# Patient Record
Sex: Female | Born: 1995 | Race: Black or African American | Hispanic: No | Marital: Single | Smoking: Never smoker
Health system: Southern US, Community
[De-identification: ages and names within clinical notes are randomized; demographics above are authoritative.]

## PROBLEM LIST (undated history)

## (undated) ENCOUNTER — Inpatient Hospital Stay (HOSPITAL_COMMUNITY): Payer: Self-pay

## (undated) DIAGNOSIS — R21 Rash and other nonspecific skin eruption: Secondary | ICD-10-CM

## (undated) DIAGNOSIS — F419 Anxiety disorder, unspecified: Secondary | ICD-10-CM

## (undated) DIAGNOSIS — T7840XA Allergy, unspecified, initial encounter: Secondary | ICD-10-CM

## (undated) DIAGNOSIS — F32A Depression, unspecified: Secondary | ICD-10-CM

## (undated) DIAGNOSIS — A6000 Herpesviral infection of urogenital system, unspecified: Secondary | ICD-10-CM

## (undated) HISTORY — PX: NO PAST SURGERIES: SHX2092

---
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
## 2013-03-19 ENCOUNTER — Inpatient Hospital Stay (HOSPITAL_COMMUNITY)
Admission: AD | Admit: 2013-03-19 | Discharge: 2013-03-19 | Disposition: A | Discharge status: Home / Self Care | Payer: PRIVATE HEALTH INSURANCE | Source: Ambulatory Visit | Attending: Obstetrics and Gynecology | Admitting: Obstetrics and Gynecology

## 2013-03-19 ENCOUNTER — Encounter (HOSPITAL_COMMUNITY): Payer: Self-pay | Admitting: *Deleted

## 2013-03-19 DIAGNOSIS — B9689 Other specified bacterial agents as the cause of diseases classified elsewhere: Secondary | ICD-10-CM | POA: Insufficient documentation

## 2013-03-19 DIAGNOSIS — N760 Acute vaginitis: Secondary | ICD-10-CM | POA: Insufficient documentation

## 2013-03-19 DIAGNOSIS — N898 Other specified noninflammatory disorders of vagina: Secondary | ICD-10-CM | POA: Insufficient documentation

## 2013-03-19 DIAGNOSIS — R300 Dysuria: Secondary | ICD-10-CM | POA: Insufficient documentation

## 2013-03-19 DIAGNOSIS — R21 Rash and other nonspecific skin eruption: Secondary | ICD-10-CM | POA: Insufficient documentation

## 2013-03-19 DIAGNOSIS — N949 Unspecified condition associated with female genital organs and menstrual cycle: Secondary | ICD-10-CM | POA: Insufficient documentation

## 2013-03-19 DIAGNOSIS — A499 Bacterial infection, unspecified: Secondary | ICD-10-CM | POA: Insufficient documentation

## 2013-03-19 HISTORY — DX: Allergy, unspecified, initial encounter: T78.40XA

## 2013-03-19 HISTORY — DX: Rash and other nonspecific skin eruption: R21

## 2013-03-19 LAB — URINE MICROSCOPIC-ADD ON

## 2013-03-19 LAB — URINALYSIS, ROUTINE W REFLEX MICROSCOPIC
Bilirubin Urine: NEGATIVE
Glucose, UA: NEGATIVE mg/dL
KETONES UR: NEGATIVE mg/dL
LEUKOCYTES UA: NEGATIVE
Nitrite: NEGATIVE
PROTEIN: NEGATIVE mg/dL
Specific Gravity, Urine: >1.03 — ABNORMAL HIGH (ref 1.005–1.030)
Urobilinogen, UA: 0.2 mg/dL (ref 0.0–1.0)
pH: 6 (ref 5.0–8.0)

## 2013-03-19 LAB — WET PREP, GENITAL
Trich, Wet Prep: NONE SEEN
YEAST WET PREP: NONE SEEN

## 2013-03-19 LAB — POCT PREGNANCY, URINE: Preg Test, Ur: NEGATIVE

## 2013-03-19 LAB — RPR: RPR Ser Ql: NONREACTIVE

## 2013-03-19 MED ORDER — ACYCLOVIR 400 MG PO TABS: ORAL_TABLET | ORAL | Status: DC

## 2013-03-19 MED ORDER — METRONIDAZOLE 500 MG PO TABS: 500.0000 mg | ORAL_TABLET | Freq: Two times a day (BID) | ORAL | Status: DC

## 2013-03-19 NOTE — MAU Note (Signed)
C/o vaginal rash that is very painful; c/o vaginal burning and itching also; pain with urination; states that she has not ever had a pelvic exam; sexually active and has implanon in place;

## 2013-03-19 NOTE — MAU Note (Signed)
Vaginal pain for last 3 days, also has noticed small red bumps on genitalia.  Having period.

## 2013-03-19 NOTE — MAU Provider Note (Signed)
History     CSN: [id number]  Arrival date and time: 03/19/13 1042   First Provider Initiated Contact with Patient 03/19/13 1216      Chief Complaint  Patient presents with  . Vaginal Pain   HPI  Pt is a 18 yo G0P0 here with report of vaginal rash that is very painful that first occurred in October 2014 and saw a provider, however a diagnosis was not made.  Rash was on entire body.  Currently, have small red bumps on vagina noticed approximately three days ago.  Rash itches and burns.  Reports dysuria.  Sexually active and with partner x 1.5 yrs.  Implanon for family planning.     Past Medical History  Diagnosis Date  . Rash due to allergy     Past Surgical History  Procedure Laterality Date  . No past surgeries      Family History  Problem Relation Age of Onset  . Alcohol abuse Neg Hx   . Arthritis Neg Hx   . Asthma Neg Hx   . Birth defects Neg Hx   . Cancer Neg Hx   . COPD Neg Hx   . Depression Neg Hx   . Diabetes Neg Hx   . Drug abuse Neg Hx   . Early death Neg Hx   . Hearing loss Neg Hx   . Heart disease Neg Hx   . Hyperlipidemia Neg Hx   . Hypertension Neg Hx   . Kidney disease Neg Hx   . Learning disabilities Neg Hx   . Mental illness Neg Hx   . Miscarriages / Stillbirths Neg Hx   . Mental retardation Neg Hx   . Vision loss Neg Hx   . Varicose Veins Neg Hx   . Stroke Neg Hx     History  Substance Use Topics  . Smoking status: Never Smoker   . Smokeless tobacco: Not on file  . Alcohol Use: No    Allergies: No Known Allergies  No prescriptions prior to admission    Review of Systems  Constitutional: Negative for fever.  Genitourinary: Positive for dysuria.       Vaginal lesions  All other systems reviewed and are negative.   Physical Exam   Blood pressure 72/42, pulse 89, temperature 98.8 F (37.1 C), temperature source Oral, resp. rate 18, height 5\' 8"  (1.727 m), weight 76.749 kg (169 lb 3.2 oz), last menstrual period  03/11/2013.  Physical Exam  Constitutional: She is oriented to person, place, and time. She appears well-developed and well-nourished. No distress.  HENT:  Head: Normocephalic.  Eyes: Pupils are equal, round, and reactive to light.  Neck: Normal range of motion. Neck supple.  Cardiovascular: Normal rate, regular rhythm and normal heart sounds.   Respiratory: Effort normal and breath sounds normal.  GI: Soft. There is no tenderness.  Genitourinary:    Cervix exhibits no motion tenderness. Right adnexum displays no mass and no tenderness. Left adnexum displays no mass and no tenderness. Vaginal discharge (creamy, yellowish discharge) found.  Negative cervical motion tenderness  Neurological: She is alert and oriented to person, place, and time. She has normal reflexes.  Skin: Skin is warm and dry.    MAU Course  Procedures  Results for orders placed during the hospital encounter of 03/19/13 (from the past 24 hour(s))  URINALYSIS, ROUTINE W REFLEX MICROSCOPIC     Status: Abnormal   Collection Time    03/19/13 11:00 AM      Result Value Ref  Range   Color, Urine YELLOW  YELLOW   APPearance CLEAR  CLEAR   Specific Gravity, Urine >1.030 (*) 1.005 - 1.030   pH 6.0  5.0 - 8.0   Glucose, UA NEGATIVE  NEGATIVE mg/dL   Hgb urine dipstick TRACE (*) NEGATIVE   Bilirubin Urine NEGATIVE  NEGATIVE   Ketones, ur NEGATIVE  NEGATIVE mg/dL   Protein, ur NEGATIVE  NEGATIVE mg/dL   Urobilinogen, UA 0.2  0.0 - 1.0 mg/dL   Nitrite NEGATIVE  NEGATIVE   Leukocytes, UA NEGATIVE  NEGATIVE  URINE MICROSCOPIC-ADD ON     Status: Abnormal   Collection Time    03/19/13 11:00 AM      Result Value Ref Range   Squamous Epithelial / LPF FEW (*) RARE   WBC, UA 0-2  <3 WBC/hpf   RBC / HPF 0-2  <3 RBC/hpf   Urine-Other MUCOUS PRESENT    POCT PREGNANCY, URINE     Status: None   Collection Time    03/19/13 11:14 AM      Result Value Ref Range   Preg Test, Ur NEGATIVE  NEGATIVE  WET PREP, GENITAL      Status: Abnormal   Collection Time    03/19/13 12:49 PM      Result Value Ref Range   Yeast Wet Prep HPF POC NONE SEEN  NONE SEEN   Trich, Wet Prep NONE SEEN  NONE SEEN   Clue Cells Wet Prep HPF POC FEW (*) NONE SEEN   WBC, Wet Prep HPF POC MANY (*) NONE SEEN    Assessment and Plan  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

## 2013-03-20 LAB — HSV(HERPES SIMPLEX VRS) I + II AB-IGM: HERPES SIMPLEX VRS I-IGM AB (EIA): 0.24 {index}

## 2013-03-20 LAB — GC/CHLAMYDIA PROBE AMP
CT Probe RNA: NEGATIVE
GC Probe RNA: NEGATIVE

## 2013-03-20 NOTE — MAU Provider Note (Signed)
Attestation of Attending Supervision of Advanced Practitioner (CNM/NP): Evaluation and management procedures were performed by the Advanced Practitioner under my supervision and collaboration.  I have reviewed the Advanced Practitioner's note and chart, and I agree with the management and plan.  [name] 03/20/2013 7:58 AM

## 2013-03-21 LAB — HERPES SIMPLEX VIRUS CULTURE: Culture: DETECTED

## 2013-03-24 ENCOUNTER — Telehealth: Payer: Self-pay | Admitting: Advanced Practice Midwife

## 2013-03-24 NOTE — Telephone Encounter (Signed)
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 VM left instructing her call MAU. Results not left on VM.

## 2013-07-07 ENCOUNTER — Inpatient Hospital Stay (HOSPITAL_COMMUNITY)
Admission: AD | Admit: 2013-07-07 | Discharge: 2013-07-08 | Disposition: A | Discharge status: Home / Self Care | Payer: PRIVATE HEALTH INSURANCE | Source: Ambulatory Visit | Attending: Obstetrics & Gynecology | Admitting: Obstetrics & Gynecology

## 2013-07-07 DIAGNOSIS — O210 Mild hyperemesis gravidarum: Secondary | ICD-10-CM | POA: Insufficient documentation

## 2013-07-07 DIAGNOSIS — B3731 Acute candidiasis of vulva and vagina: Secondary | ICD-10-CM | POA: Insufficient documentation

## 2013-07-07 DIAGNOSIS — B379 Candidiasis, unspecified: Secondary | ICD-10-CM

## 2013-07-07 DIAGNOSIS — O219 Vomiting of pregnancy, unspecified: Secondary | ICD-10-CM

## 2013-07-07 DIAGNOSIS — R109 Unspecified abdominal pain: Secondary | ICD-10-CM | POA: Insufficient documentation

## 2013-07-07 DIAGNOSIS — O2390 Unspecified genitourinary tract infection in pregnancy, unspecified trimester: Secondary | ICD-10-CM | POA: Insufficient documentation

## 2013-07-07 DIAGNOSIS — B373 Candidiasis of vulva and vagina: Secondary | ICD-10-CM | POA: Insufficient documentation

## 2013-07-07 NOTE — MAU Note (Signed)
Pt states she had +HPT. Today has had a lot of cramping and nausea/vomiting. Vaginal bleeding started today. Pt thinks LMP was the first week of may but is unsure.

## 2013-07-08 ENCOUNTER — Inpatient Hospital Stay (HOSPITAL_COMMUNITY): Payer: PRIVATE HEALTH INSURANCE

## 2013-07-08 ENCOUNTER — Encounter (HOSPITAL_COMMUNITY): Payer: Self-pay

## 2013-07-08 DIAGNOSIS — O219 Vomiting of pregnancy, unspecified: Secondary | ICD-10-CM

## 2013-07-08 LAB — CBC
HEMATOCRIT: 36.7 % (ref 36.0–46.0)
Hemoglobin: 12.5 g/dL (ref 12.0–15.0)
MCH: 29.8 pg (ref 26.0–34.0)
MCHC: 34.1 g/dL (ref 30.0–36.0)
MCV: 87.6 fL (ref 78.0–100.0)
Platelets: 240 10*3/uL (ref 150–400)
RBC: 4.19 MIL/uL (ref 3.87–5.11)
RDW: 13.2 % (ref 11.5–15.5)
WBC: 7.2 10*3/uL (ref 4.0–10.5)

## 2013-07-08 LAB — WET PREP, GENITAL
Clue Cells Wet Prep HPF POC: NONE SEEN
Trich, Wet Prep: NONE SEEN

## 2013-07-08 LAB — URINALYSIS, ROUTINE W REFLEX MICROSCOPIC
Bilirubin Urine: NEGATIVE
Glucose, UA: NEGATIVE mg/dL
Ketones, ur: >80 mg/dL — AB
LEUKOCYTES UA: NEGATIVE
Nitrite: NEGATIVE
PROTEIN: 30 mg/dL — AB
Specific Gravity, Urine: >1.03 — ABNORMAL HIGH (ref 1.005–1.030)
UROBILINOGEN UA: 1 mg/dL (ref 0.0–1.0)
pH: 6 (ref 5.0–8.0)

## 2013-07-08 LAB — URINE MICROSCOPIC-ADD ON

## 2013-07-08 LAB — GC/CHLAMYDIA PROBE AMP
CT Probe RNA: NEGATIVE
GC Probe RNA: NEGATIVE

## 2013-07-08 LAB — POCT PREGNANCY, URINE: PREG TEST UR: POSITIVE — AB

## 2013-07-08 LAB — HCG, QUANTITATIVE, PREGNANCY: hCG, Beta Chain, Quant, S: 65731 m[IU]/mL — ABNORMAL HIGH (ref <5)

## 2013-07-08 LAB — ABO/RH: ABO/RH(D): A POS

## 2013-07-08 MED ORDER — METOCLOPRAMIDE HCL 10 MG PO TABS: 10.0000 mg | ORAL_TABLET | Freq: Three times a day (TID) | ORAL | Status: DC

## 2013-07-08 MED ORDER — FLUCONAZOLE 150 MG PO TABS
150.0000 mg | ORAL_TABLET | Freq: Once | ORAL | Status: AC
  Administered 2013-07-08: 150 mg via ORAL
  Filled 2013-07-08: qty 1

## 2013-07-08 MED ORDER — ONDANSETRON 8 MG PO TBDP
8.0000 mg | ORAL_TABLET | Freq: Once | ORAL | Status: AC
  Administered 2013-07-08: 8 mg via ORAL
  Filled 2013-07-08: qty 1

## 2013-07-08 NOTE — MAU Provider Note (Signed)
History     CSN: [id number]  Arrival date and time: 07/07/13 2331   First Provider Initiated Contact with Patient 07/08/13 0050      Chief Complaint  Patient presents with  . Abdominal Cramping  . Nausea  . Emesis   Abdominal Cramping Associated symptoms include nausea and vomiting. Pertinent negatives include no constipation, diarrhea, dysuria, fever or frequency.  Emesis  Associated symptoms include abdominal pain (cramping). Pertinent negatives include no chills, diarrhea or fever.    Pt is a 18 yo here with report of vomiting x 2 days.  Vomited 6-7x.  Denies fever, body aches, or chills.  +pelvic cramping that started day.  Light bleeding noted today.  Uncertain regarding LMP.    Past Medical History  Diagnosis Date  . Rash due to allergy     Past Surgical History  Procedure Laterality Date  . No past surgeries      Family History  Problem Relation Age of Onset  . Alcohol abuse Neg Hx   . Arthritis Neg Hx   . Asthma Neg Hx   . Birth defects Neg Hx   . Cancer Neg Hx   . COPD Neg Hx   . Depression Neg Hx   . Diabetes Neg Hx   . Drug abuse Neg Hx   . Early death Neg Hx   . Hearing loss Neg Hx   . Heart disease Neg Hx   . Hyperlipidemia Neg Hx   . Hypertension Neg Hx   . Kidney disease Neg Hx   . Learning disabilities Neg Hx   . Mental illness Neg Hx   . Miscarriages / Stillbirths Neg Hx   . Mental retardation Neg Hx   . Vision loss Neg Hx   . Varicose Veins Neg Hx   . Stroke Neg Hx     History  Substance Use Topics  . Smoking status: Never Smoker   . Smokeless tobacco: Not on file  . Alcohol Use: No    Allergies: No Known Allergies  Prescriptions prior to admission  Medication Sig Dispense Refill  . acyclovir (ZOVIRAX) 400 MG tablet Take one tablet three times a day x 7 days.  Repeat if lesions reappear.  90 tablet  1  . metroNIDAZOLE (FLAGYL) 500 MG tablet Take 1 tablet (500 mg total) by mouth 2 (two) times daily.  14 tablet  0    Review of  Systems  Constitutional: Negative for fever and chills.  Gastrointestinal: Positive for nausea, vomiting and abdominal pain (cramping). Negative for diarrhea and constipation.  Genitourinary: Negative for dysuria, urgency and frequency.  All other systems reviewed and are negative.  Physical Exam   Blood pressure 115/70, pulse 86, temperature 98.8 F (37.1 C), temperature source Oral, resp. rate 18, height 5\' 7"  (1.702 m), weight 77.656 kg (171 lb 3.2 oz), SpO2 98.00%.  Physical Exam  Constitutional: She is oriented to person, place, and time. She appears well-developed and well-nourished. No distress.  HENT:  Head: Normocephalic.  Neck: Normal range of motion. Neck supple.  Cardiovascular: Normal rate and regular rhythm.   Respiratory: Effort normal and breath sounds normal.  GI: Soft. There is no tenderness.  Genitourinary: Uterus is enlarged. Cervix exhibits no motion tenderness. No bleeding around the vagina. Vaginal discharge (thick white, cottage-cheese type discharge) found.  Neurological: She is alert and oriented to person, place, and time.  Skin: Skin is warm and dry.    MAU Course  Procedures Results for orders placed during the hospital  encounter of 07/07/13 (from the past 24 hour(s))  URINALYSIS, ROUTINE W REFLEX MICROSCOPIC     Status: Abnormal   Collection Time    07/07/13 11:38 PM      Result Value Ref Range   Color, Urine YELLOW  YELLOW   APPearance CLEAR  CLEAR   Specific Gravity, Urine >1.030 (*) 1.005 - 1.030   pH 6.0  5.0 - 8.0   Glucose, UA NEGATIVE  NEGATIVE mg/dL   Hgb urine dipstick TRACE (*) NEGATIVE   Bilirubin Urine NEGATIVE  NEGATIVE   Ketones, ur >80 (*) NEGATIVE mg/dL   Protein, ur 30 (*) NEGATIVE mg/dL   Urobilinogen, UA 1.0  0.0 - 1.0 mg/dL   Nitrite NEGATIVE  NEGATIVE   Leukocytes, UA NEGATIVE  NEGATIVE  URINE MICROSCOPIC-ADD ON     Status: None   Collection Time    07/07/13 11:38 PM      Result Value Ref Range   Squamous Epithelial /  LPF RARE  RARE   WBC, UA 0-2  <3 WBC/hpf   RBC / HPF 0-2  <3 RBC/hpf   Bacteria, UA RARE  RARE   Urine-Other MUCOUS PRESENT    POCT PREGNANCY, URINE     Status: Abnormal   Collection Time    07/08/13 12:51 AM      Result Value Ref Range   Preg Test, Ur POSITIVE (*) NEGATIVE  WET PREP, GENITAL     Status: Abnormal   Collection Time    07/08/13  1:00 AM      Result Value Ref Range   Yeast Wet Prep HPF POC FEW (*) NONE SEEN   Trich, Wet Prep NONE SEEN  NONE SEEN   Clue Cells Wet Prep HPF POC NONE SEEN  NONE SEEN   WBC, Wet Prep HPF POC FEW (*) NONE SEEN  CBC     Status: None   Collection Time    07/08/13  1:10 AM      Result Value Ref Range   WBC 7.2  4.0 - 10.5 K/uL   RBC 4.19  3.87 - 5.11 MIL/uL   Hemoglobin 12.5  12.0 - 15.0 g/dL   HCT 16.136.7  09.636.0 - 04.546.0 %   MCV 87.6  78.0 - 100.0 fL   MCH 29.8  26.0 - 34.0 pg   MCHC 34.1  30.0 - 36.0 g/dL   RDW 40.913.2  81.111.5 - 91.415.5 %   Platelets 240  150 - 400 K/uL  HCG, QUANTITATIVE, PREGNANCY     Status: Abnormal   Collection Time    07/08/13  1:10 AM      Result Value Ref Range   hCG, Beta Chain, Quant, [place] [id number] (*) <5 mIU/mL  ABO/RH     Status: None   Collection Time    07/08/13  1:10 AM      Result Value Ref Range   ABO/RH(D) A POS     Ultrasound: FINDINGS:  Intrauterine gestational sac: A single intrauterine pregnancy is  identified.  Yolk sac: Yolk sac is present.  Embryo: Fetal pole is present.  Cardiac Activity: Fetal cardiac activity is identified.  Heart Rate: 111 bpm  CRL: 3.9 mm 6 w 1 d US EDC: 03/02/2014  Maternal uterus/adnexae: No subchorionic hemorrhage. Retroverted  uterus. No myometrial mass lesions identified. Small amount of free  pelvic fluid. Both ovaries are identified. A simple appearing cyst  is demonstrated on the right ovary, likely functional. No left  adnexal mass lesions.  IMPRESSION:  Single intrauterine  pregnancy. Estimated gestational age by  crown-rump length is 6 weeks 1 day. No acute  complication is  identified.   Assessment and Plan  Nausea and Vomiting in Pregnancy Abdominal Pain in Pregnancy Yeast Infection  Plan: Discharge to home Diflucan 150 mg PO in MAU Begin Prenatal Care  [facility],Tiondra Fang 07/08/2013, 12:51 AM

## 2013-11-18 ENCOUNTER — Encounter (HOSPITAL_COMMUNITY): Payer: Self-pay

## 2014-05-13 ENCOUNTER — Encounter (HOSPITAL_COMMUNITY): Payer: Self-pay | Admitting: *Deleted

## 2015-09-04 IMAGING — US US OB COMP LESS 14 WK
1 series · 13 of 28 positions shown · non-contrast
Comparison: None.

CLINICAL DATA: Pelvic pain and bleeding. Estimated gestational age
by LMP is 7 weeks 1 day. Quantitative beta HCG is [DATE].

EXAM:
OBSTETRIC <14 WK US AND TRANSVAGINAL OB US
TECHNIQUE: Both transabdominal and transvaginal ultrasound examinations were
performed for complete evaluation of the gestation as well as the
maternal uterus, adnexal regions, and pelvic cul-de-sac.
Transvaginal technique was performed to assess early pregnancy.

[Series 1: us ob comp less 14 wks · 42 acquisitions, 13 frames shown]
[im 2/42]
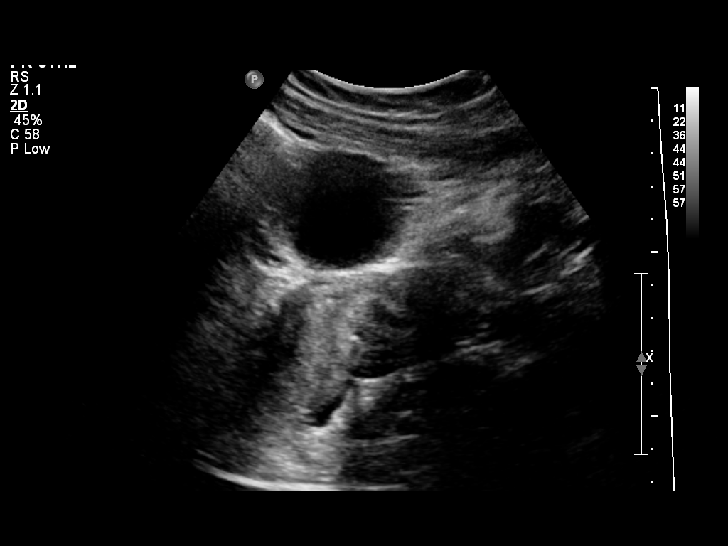
[im 5/42]
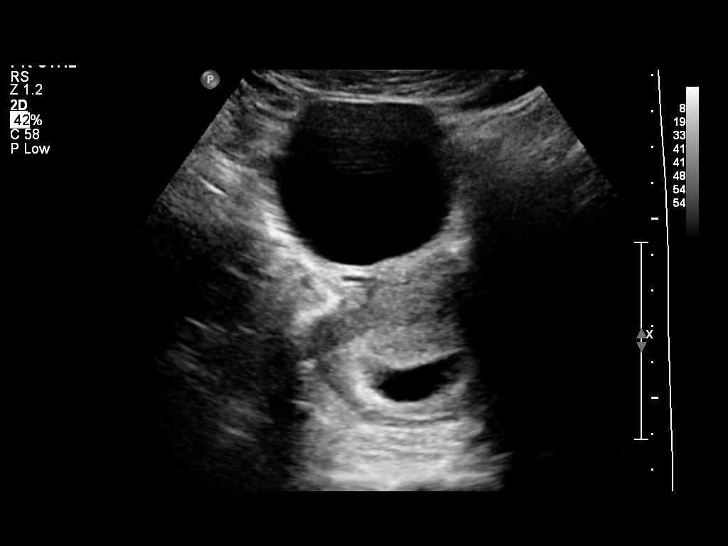
[im 8/42]
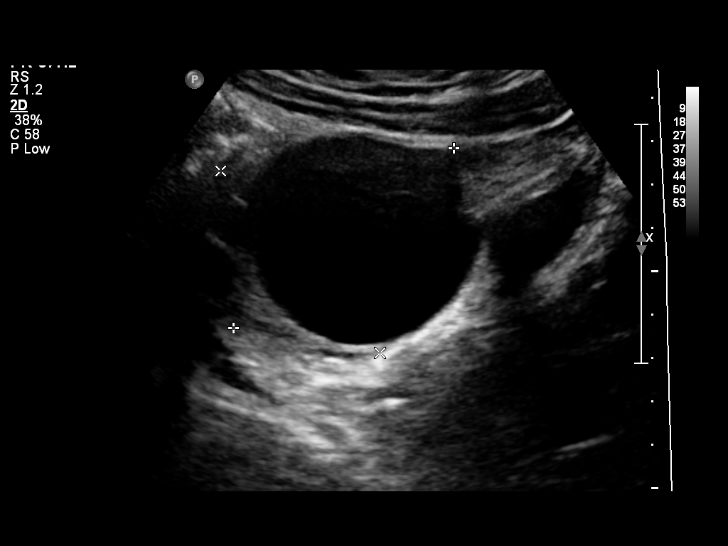
[im 11/42]
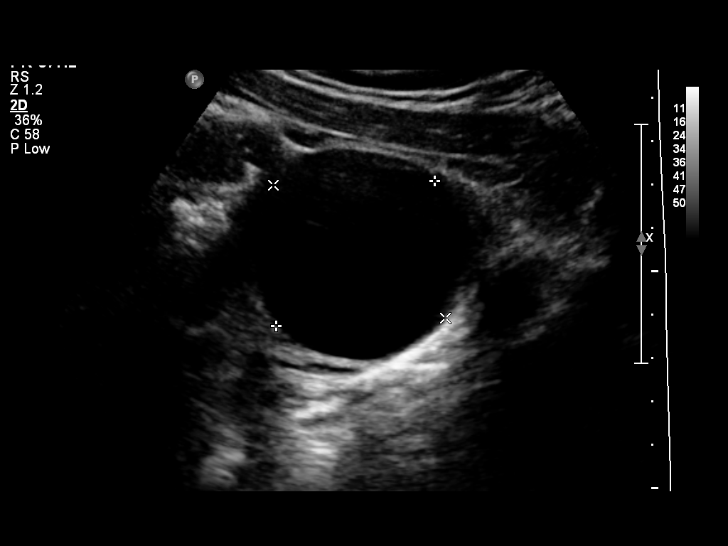
[im 14/42]
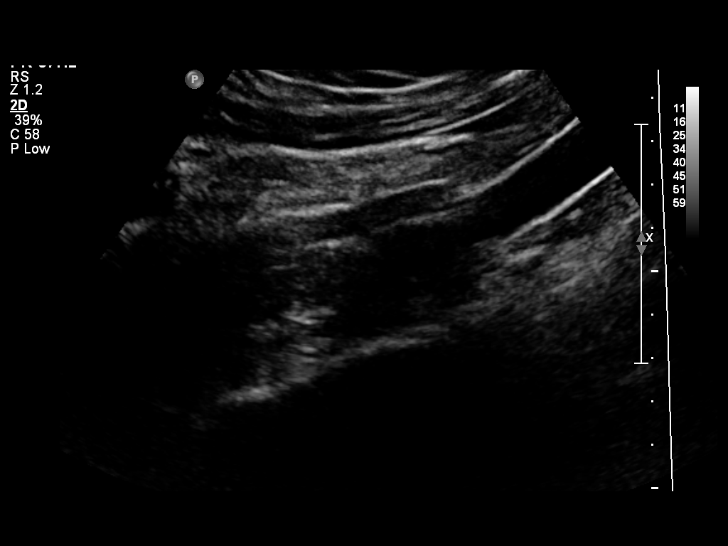
[im 17/42]
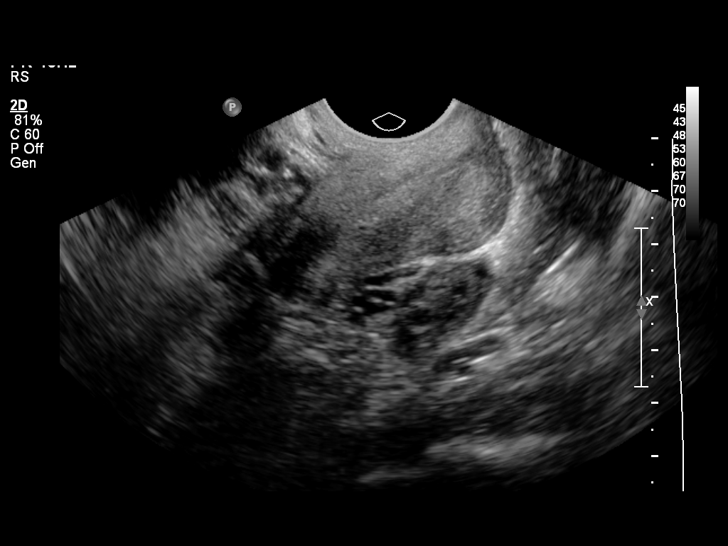
[im 22/42]
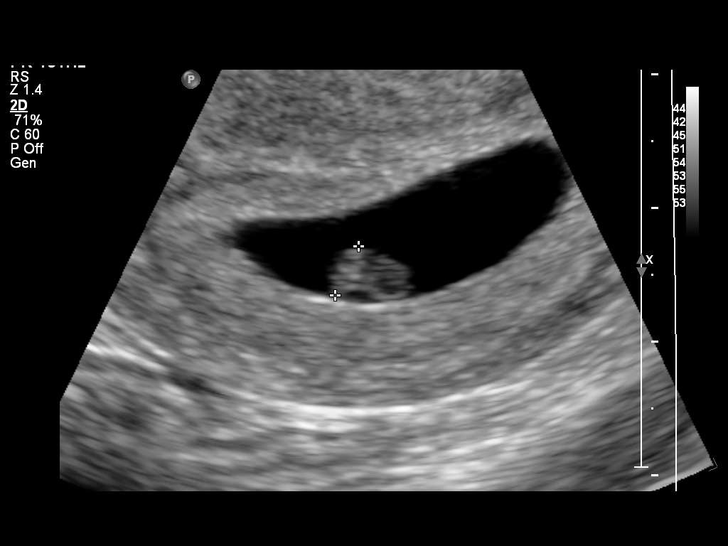
[im 25/42]
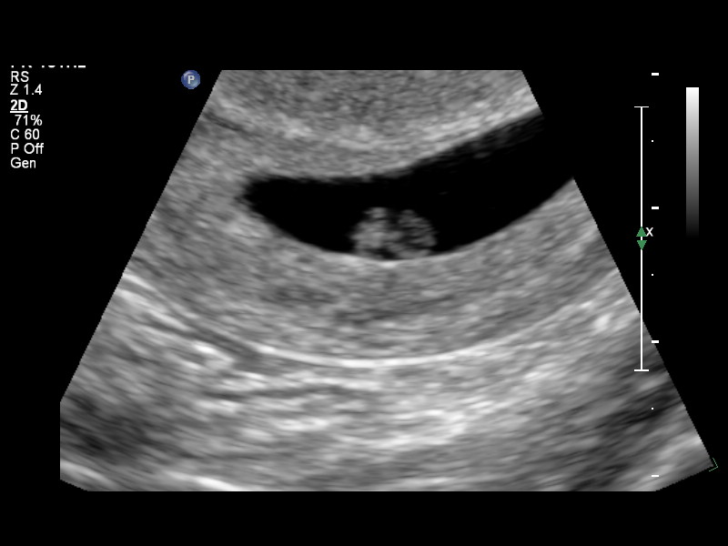
[im 28/42]
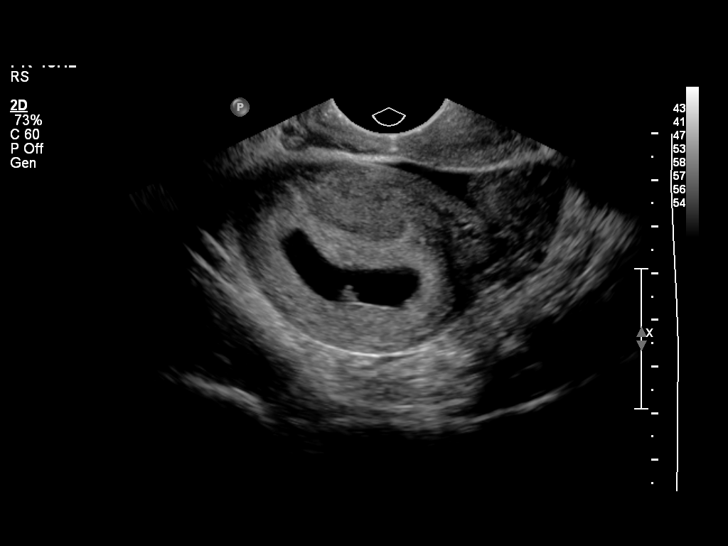
[im 31/42]
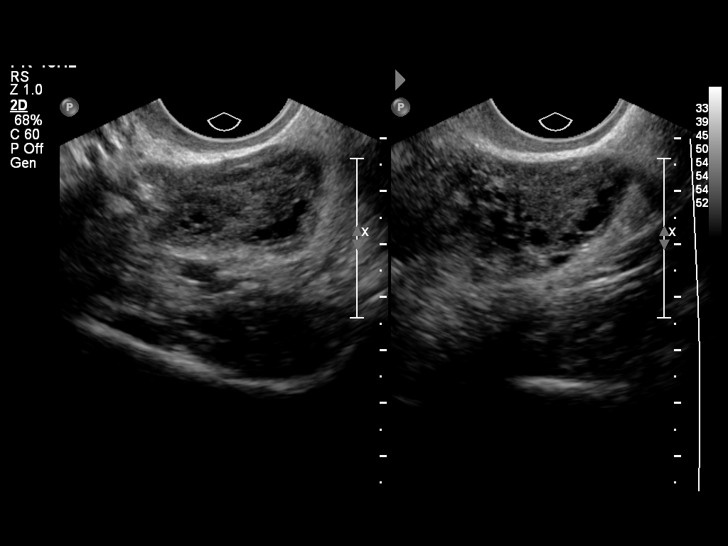
[im 34/42]
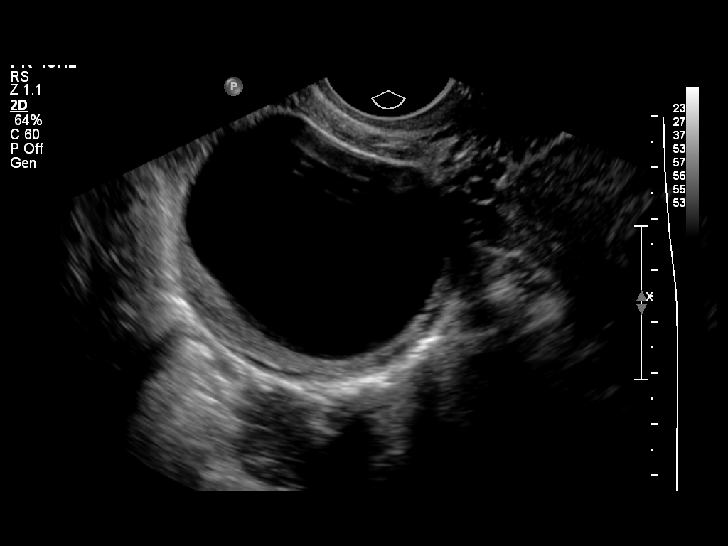
[im 37/42]
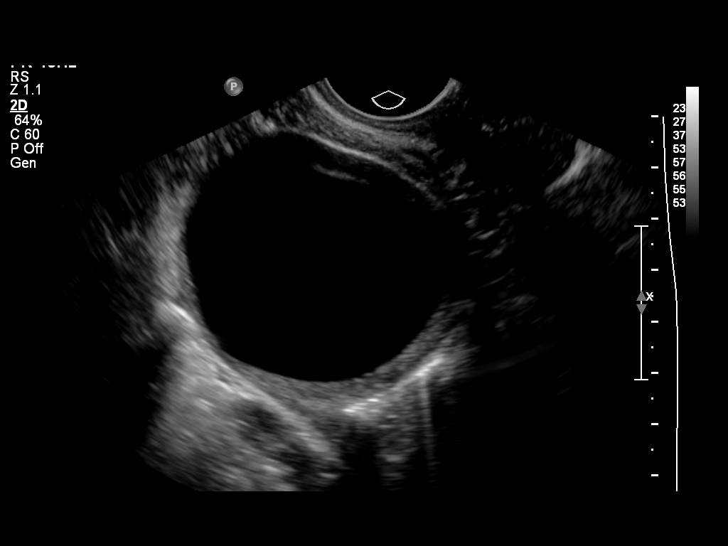
[im 40/42]
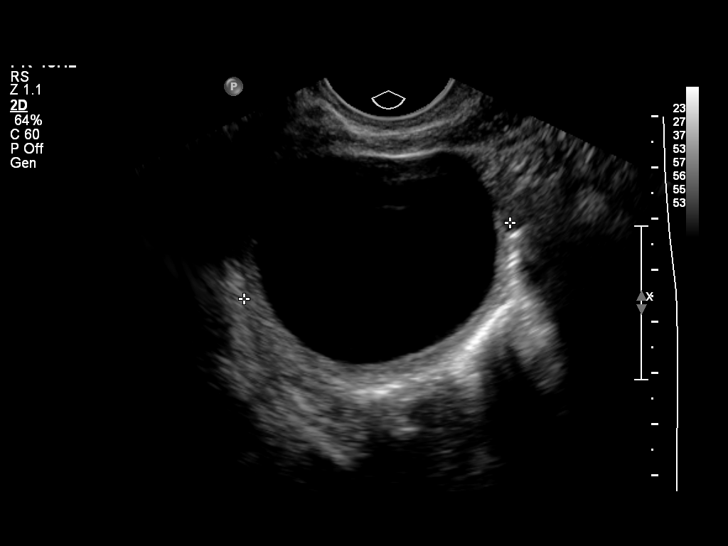

[13 of 28 positions shown; findings below may reference images not displayed]

FINDINGS: Intrauterine gestational sac: A single intrauterine pregnancy is
identified.

Yolk sac:  Yolk sac is present.

Embryo:  Fetal pole is present.

Cardiac Activity: Fetal cardiac activity is identified.

Heart Rate:  111 bpm

CRL:   3.9  mm   6 w 1 d                  US EDC: 03/02/2014

Maternal uterus/adnexae: No subchorionic hemorrhage. Retroverted
uterus. No myometrial mass lesions identified. Small amount of free
pelvic fluid. Both ovaries are identified. A simple appearing cyst
is demonstrated on the right ovary, likely functional. No left
adnexal mass lesions.
IMPRESSION: Single intrauterine pregnancy. Estimated gestational age by
crown-rump length is 6 weeks 1 day. No acute complication is
identified.

## 2020-10-29 ENCOUNTER — Emergency Department (HOSPITAL_BASED_OUTPATIENT_CLINIC_OR_DEPARTMENT_OTHER): Payer: Self-pay

## 2020-10-29 ENCOUNTER — Other Ambulatory Visit: Payer: Self-pay

## 2020-10-29 ENCOUNTER — Encounter (HOSPITAL_BASED_OUTPATIENT_CLINIC_OR_DEPARTMENT_OTHER): Payer: Self-pay | Admitting: *Deleted

## 2020-10-29 ENCOUNTER — Inpatient Hospital Stay (HOSPITAL_BASED_OUTPATIENT_CLINIC_OR_DEPARTMENT_OTHER)
Admission: AD | Admit: 2020-10-29 | Discharge: 2020-10-30 | Disposition: A | Discharge status: Home / Self Care | Payer: Medicaid Other | Attending: Obstetrics and Gynecology | Admitting: Obstetrics and Gynecology

## 2020-10-29 DIAGNOSIS — R102 Pelvic and perineal pain: Secondary | ICD-10-CM | POA: Diagnosis not present

## 2020-10-29 DIAGNOSIS — O26891 Other specified pregnancy related conditions, first trimester: Secondary | ICD-10-CM | POA: Diagnosis not present

## 2020-10-29 DIAGNOSIS — R1011 Right upper quadrant pain: Secondary | ICD-10-CM | POA: Insufficient documentation

## 2020-10-29 DIAGNOSIS — O009 Unspecified ectopic pregnancy without intrauterine pregnancy: Secondary | ICD-10-CM

## 2020-10-29 DIAGNOSIS — O2631 Retained intrauterine contraceptive device in pregnancy, first trimester: Secondary | ICD-10-CM | POA: Diagnosis not present

## 2020-10-29 DIAGNOSIS — O209 Hemorrhage in early pregnancy, unspecified: Secondary | ICD-10-CM | POA: Insufficient documentation

## 2020-10-29 DIAGNOSIS — Z79899 Other long term (current) drug therapy: Secondary | ICD-10-CM | POA: Insufficient documentation

## 2020-10-29 DIAGNOSIS — O3680X0 Pregnancy with inconclusive fetal viability, not applicable or unspecified: Secondary | ICD-10-CM | POA: Diagnosis not present

## 2020-10-29 DIAGNOSIS — N9489 Other specified conditions associated with female genital organs and menstrual cycle: Secondary | ICD-10-CM | POA: Diagnosis not present

## 2020-10-29 DIAGNOSIS — Z3490 Encounter for supervision of normal pregnancy, unspecified, unspecified trimester: Secondary | ICD-10-CM

## 2020-10-29 DIAGNOSIS — Z3A00 Weeks of gestation of pregnancy not specified: Secondary | ICD-10-CM | POA: Insufficient documentation

## 2020-10-29 DIAGNOSIS — Z640 Problems related to unwanted pregnancy: Secondary | ICD-10-CM | POA: Diagnosis not present

## 2020-10-29 HISTORY — DX: Herpesviral infection of urogenital system, unspecified: A60.00

## 2020-10-29 HISTORY — DX: Depression, unspecified: F32.A

## 2020-10-29 HISTORY — DX: Anxiety disorder, unspecified: F41.9

## 2020-10-29 LAB — URINALYSIS, ROUTINE W REFLEX MICROSCOPIC
Bilirubin Urine: NEGATIVE
Glucose, UA: NEGATIVE mg/dL
Hgb urine dipstick: NEGATIVE
Ketones, ur: NEGATIVE mg/dL
Leukocytes,Ua: NEGATIVE
Nitrite: NEGATIVE
Protein, ur: NEGATIVE mg/dL
Specific Gravity, Urine: 1.005 (ref 1.005–1.030)
pH: 6 (ref 5.0–8.0)

## 2020-10-29 LAB — CBC WITH DIFFERENTIAL/PLATELET
Abs Immature Granulocytes: 0.01 10*3/uL (ref 0.00–0.07)
Basophils Absolute: 0.1 10*3/uL (ref 0.0–0.1)
Basophils Relative: 1 %
Eosinophils Absolute: 0.1 10*3/uL (ref 0.0–0.5)
Eosinophils Relative: 2 %
HCT: 38.6 % (ref 36.0–46.0)
Hemoglobin: 12.7 g/dL (ref 12.0–15.0)
Immature Granulocytes: 0 %
Lymphocytes Relative: 36 %
Lymphs Abs: 2.3 10*3/uL (ref 0.7–4.0)
MCH: 31 pg (ref 26.0–34.0)
MCHC: 32.9 g/dL (ref 30.0–36.0)
MCV: 94.1 fL (ref 80.0–100.0)
Monocytes Absolute: 0.7 10*3/uL (ref 0.1–1.0)
Monocytes Relative: 10 %
Neutro Abs: 3.3 10*3/uL (ref 1.7–7.7)
Neutrophils Relative %: 51 %
Platelets: 266 10*3/uL (ref 150–400)
RBC: 4.1 MIL/uL (ref 3.87–5.11)
RDW: 13.8 % (ref 11.5–15.5)
WBC: 6.4 10*3/uL (ref 4.0–10.5)
nRBC: 0 % (ref 0.0–0.2)

## 2020-10-29 LAB — BASIC METABOLIC PANEL
Anion gap: 8 (ref 5–15)
BUN: 7 mg/dL (ref 6–20)
CO2: 24 mmol/L (ref 22–32)
Calcium: 9.4 mg/dL (ref 8.9–10.3)
Chloride: 105 mmol/L (ref 98–111)
Creatinine, Ser: 0.68 mg/dL (ref 0.44–1.00)
GFR, Estimated: >60 mL/min (ref >60)
Glucose, Bld: 94 mg/dL (ref 70–99)
Potassium: 3.8 mmol/L (ref 3.5–5.1)
Sodium: 137 mmol/L (ref 135–145)

## 2020-10-29 LAB — HCG, QUANTITATIVE, PREGNANCY: hCG, Beta Chain, Quant, S: 405 m[IU]/mL — ABNORMAL HIGH (ref <5)

## 2020-10-29 LAB — HEPATIC FUNCTION PANEL
ALT: 10 U/L (ref 0–44)
AST: 16 U/L (ref 15–41)
Albumin: 4.3 g/dL (ref 3.5–5.0)
Alkaline Phosphatase: 46 U/L (ref 38–126)
Bilirubin, Direct: <0.1 mg/dL (ref 0.0–0.2)
Total Bilirubin: 0.3 mg/dL (ref 0.3–1.2)
Total Protein: 7.8 g/dL (ref 6.5–8.1)

## 2020-10-29 LAB — LIPASE, BLOOD: Lipase: 24 U/L (ref 11–51)

## 2020-10-29 LAB — PREGNANCY, URINE: Preg Test, Ur: POSITIVE — AB

## 2020-10-29 MED ORDER — LIDOCAINE VISCOUS HCL 2 % MT SOLN
15.0000 mL | Freq: Once | OROMUCOSAL | Status: AC
  Administered 2020-10-29: 15 mL via ORAL
  Filled 2020-10-29: qty 15

## 2020-10-29 MED ORDER — ALUM & MAG HYDROXIDE-SIMETH 200-200-20 MG/5ML PO SUSP
30.0000 mL | Freq: Once | ORAL | Status: AC
  Administered 2020-10-29: 30 mL via ORAL
  Filled 2020-10-29: qty 30

## 2020-10-29 MED ORDER — HYDROMORPHONE HCL 1 MG/ML IJ SOLN
1.0000 mg | Freq: Once | INTRAMUSCULAR | Status: AC
  Administered 2020-10-29: 1 mg via INTRAVENOUS
  Filled 2020-10-29: qty 1

## 2020-10-29 MED ORDER — ONDANSETRON HCL 4 MG/2ML IJ SOLN
4.0000 mg | Freq: Once | INTRAMUSCULAR | Status: AC
  Administered 2020-10-29: 4 mg via INTRAVENOUS
  Filled 2020-10-29: qty 2

## 2020-10-29 MED ORDER — METHOTREXATE FOR ECTOPIC PREGNANCY
50.0000 mg/m2 | Freq: Once | INTRAMUSCULAR | Status: AC
  Administered 2020-10-30: 92.5 mg via INTRAMUSCULAR
  Filled 2020-10-29: qty 3.7

## 2020-10-29 MED ORDER — ACETAMINOPHEN 500 MG PO TABS
1000.0000 mg | ORAL_TABLET | Freq: Once | ORAL | Status: AC
  Administered 2020-10-29: 1000 mg via ORAL
  Filled 2020-10-29: qty 2

## 2020-10-29 MED ORDER — ONDANSETRON 4 MG PO TBDP: 4.0000 mg | ORAL_TABLET | ORAL | Status: DC | PRN

## 2020-10-29 NOTE — MAU Note (Signed)
PT transferred by [facility] from [facility]. She presented there with abd pain and found out she is pregnant. Has IUD in place. Has had VB since 9/30 when she thinks her period started. Pt alert and oriented and is not having any pain currently.

## 2020-10-29 NOTE — MAU Note (Signed)
MTX/ectopic info sheet given to pt and discussed what to expect and sx for which to return.

## 2020-10-29 NOTE — ED Triage Notes (Signed)
Lower abdominal pain x 3 days. Denies vaginal discharge. No constipation or diarrhea. Menses has lasted 30 days.

## 2020-10-29 NOTE — MAU Provider Note (Signed)
Chief Complaint: Abdominal Pain   Event Date/Time   First Provider Initiated Contact with Patient 10/29/20 2225        SUBJECTIVE HPI: Allison Mcbride is a 25 y.o. G1P0 at [redacted]w[redacted]d by LMP who presents via [facility] from [facility] ED to maternity admissions reporting pelvic and RUQ pain for the past 3 days.  Marland KitchenHas had irregular bleeidng for a month.  Has a Kyleena IUD in place.  Did not suspect pregnancy, but was found to be pregnant in the ED.  US showed a Right adnexal mass with IUD in place.  She denies urinary symptoms, h/a, dizziness, n/v, or fever/chills.    Abdominal Pain This is a new problem. The current episode started in the past 7 days. The problem has been unchanged. The pain is located in the RLQ and RUQ. The quality of the pain is cramping. Pertinent negatives include no constipation, diarrhea, dysuria, fever, headaches, myalgias, nausea or vomiting. The pain is aggravated by palpation. The pain is relieved by Nothing. She has tried nothing for the symptoms.   ED Note: 25 year old female presenting to the emergency department with multiple complaints.  She states that she has had menstrual bleeding for the past month that has not resolved.  She states that she has an IUD in place [place]) and is not concerned for pregnancy.  She is not concerned for STIs.  She endorses right upper quadrant abdominal pain for the past 3 days with no radiation, described as sharp and worse when lying flat at night.  No shortness of breath or chest pain.  No aggravating or alleviating factors.   The patient also states that she has had lower pelvic cramping which she associated with her vaginal bleeding and what she thought to be was a prolonged menstrual cycle.  Past Medical History:  Diagnosis Date   Anxiety    Depression    Rash due to allergy    Past Surgical History:  Procedure Laterality Date   NO PAST SURGERIES     Social History   Socioeconomic History   Marital status: Single    Spouse  name: Not on file   Number of children: Not on file   Years of education: Not on file   Highest education level: Not on file  Occupational History   Not on file  Tobacco Use   Smoking status: Never   Smokeless tobacco: Never  Vaping Use   Vaping Use: Never used  Substance and Sexual Activity   Alcohol use: Yes   Drug use: No   Sexual activity: Yes    Partners: Male    Birth control/protection: I.U.D.  Other Topics Concern   Not on file  Social History Narrative   Not on file   Social Determinants of Health   Financial Resource Strain: Not on file  Food Insecurity: Not on file  Transportation Needs: Not on file  Physical Activity: Not on file  Stress: Not on file  Social Connections: Not on file  Intimate Partner Violence: Not on file   No current facility-administered medications on file prior to encounter.   Current Outpatient Medications on File Prior to Encounter  Medication Sig Dispense Refill   acyclovir (ZOVIRAX) 400 MG tablet Take one tablet three times a day x 7 days.  Repeat if lesions reappear. 90 tablet 1   metoCLOPramide (REGLAN) 10 MG tablet Take 1 tablet (10 mg total) by mouth 3 (three) times daily with meals. 90 tablet 1   No  Known Allergies  I have reviewed patient's Past Medical Hx, Surgical Hx, Family Hx, Social Hx, medications and allergies.   ROS:  Review of Systems  Constitutional:  Negative for fever.  Gastrointestinal:  Positive for abdominal pain. Negative for constipation, diarrhea, nausea and vomiting.  Genitourinary:  Negative for dysuria.  Musculoskeletal:  Negative for myalgias.  Neurological:  Negative for headaches.  Review of Systems  Other systems negative   Physical Exam  Physical Exam Patient Vitals for the past 24 hrs:  BP Temp Temp src Pulse Resp SpO2 Height Weight  10/29/20 2219 107/64 98.7 F (37.1 C) -- 88 16 -- -- --  10/29/20 2100 102/73 -- -- 81 18 96 % -- --  10/29/20 2030 113/71 98.6 F (37 C) Oral 81 18 99 %  -- --  10/29/20 1742 120/76 98.6 F (37 C) Oral 94 16 100 % -- --  10/29/20 1739 -- -- -- -- -- -- 5\' 7"  (1.702 m) 72.6 kg   Constitutional: Well-developed, well-nourished female in no acute distress.  Cardiovascular: normal rate Respiratory: normal effort GI: Abd soft, non-tender. MS: Extremities nontender, no edema, normal ROM Neurologic: Alert and oriented x 4.  GU: Neg CVAT.  PELVIC EXAM: Deferred, patient declines having IUD removed.   LAB RESULTS Results for orders placed or performed during the hospital encounter of 10/29/20 (from the past 24 hour(s))  Pregnancy, urine     Status: Abnormal   Collection Time: 10/29/20  5:41 PM  Result Value Ref Range   Preg Test, Ur POSITIVE (A) NEGATIVE  Urinalysis, Routine w reflex microscopic Urine, Clean Catch     Status: Abnormal   Collection Time: 10/29/20  5:41 PM  Result Value Ref Range   Color, Urine STRAW (A) YELLOW   APPearance CLEAR CLEAR   Specific Gravity, Urine 1.005 1.005 - 1.030   pH 6.0 5.0 - 8.0   Glucose, UA NEGATIVE NEGATIVE mg/dL   Hgb urine dipstick NEGATIVE NEGATIVE   Bilirubin Urine NEGATIVE NEGATIVE   Ketones, ur NEGATIVE NEGATIVE mg/dL   Protein, ur NEGATIVE NEGATIVE mg/dL   Nitrite NEGATIVE NEGATIVE   Leukocytes,Ua NEGATIVE NEGATIVE  CBC with Differential     Status: None   Collection Time: 10/29/20  5:58 PM  Result Value Ref Range   WBC 6.4 4.0 - 10.5 K/uL   RBC 4.10 3.87 - 5.11 MIL/uL   Hemoglobin 12.7 12.0 - 15.0 g/dL   HCT 10/31/20 76.1 - 60.7 %   MCV 94.1 80.0 - 100.0 fL   MCH 31.0 26.0 - 34.0 pg   MCHC 32.9 30.0 - 36.0 g/dL   RDW 37.1 06.2 - 69.4 %   Platelets 266 150 - 400 K/uL   nRBC 0.0 0.0 - 0.2 %   Neutrophils Relative % 51 %   Neutro Abs 3.3 1.7 - 7.7 K/uL   Lymphocytes Relative 36 %   Lymphs Abs 2.3 0.7 - 4.0 K/uL   Monocytes Relative 10 %   Monocytes Absolute 0.7 0.1 - 1.0 K/uL   Eosinophils Relative 2 %   Eosinophils Absolute 0.1 0.0 - 0.5 K/uL   Basophils Relative 1 %   Basophils  Absolute 0.1 0.0 - 0.1 K/uL   Immature Granulocytes 0 %   Abs Immature Granulocytes 0.01 0.00 - 0.07 K/uL  Basic metabolic panel     Status: None   Collection Time: 10/29/20  5:58 PM  Result Value Ref Range   Sodium 137 135 - 145 mmol/L   Potassium 3.8 3.5 - 5.1  mmol/L   Chloride 105 98 - 111 mmol/L   CO2 24 22 - 32 mmol/L   Glucose, Bld 94 70 - 99 mg/dL   BUN 7 6 - 20 mg/dL   Creatinine, Ser 5.32 0.44 - 1.00 mg/dL   Calcium 9.4 8.9 - 99.2 mg/dL   GFR, Estimated >42 >68 mL/min   Anion gap 8 5 - 15  Lipase, blood     Status: None   Collection Time: 10/29/20  5:58 PM  Result Value Ref Range   Lipase 24 11 - 51 U/L  Hepatic function panel     Status: None   Collection Time: 10/29/20  5:58 PM  Result Value Ref Range   Total Protein 7.8 6.5 - 8.1 g/dL   Albumin 4.3 3.5 - 5.0 g/dL   AST 16 15 - 41 U/L   ALT 10 0 - 44 U/L   Alkaline Phosphatase 46 38 - 126 U/L   Total Bilirubin 0.3 0.3 - 1.2 mg/dL   Bilirubin, Direct <3.4 0.0 - 0.2 mg/dL   Indirect Bilirubin NOT CALCULATED 0.3 - 0.9 mg/dL  hCG, quantitative, pregnancy     Status: Abnormal   Collection Time: 10/29/20  5:58 PM  Result Value Ref Range   hCG, Beta Chain, Quant, S 405 (H) <5 mIU/mL    IMAGING US OB LESS THAN 14 WEEKS WITH OB TRANSVAGINAL  Result Date: 10/29/2020 CLINICAL DATA:  Right-sided pelvic pain.  Positive pregnancy test. EXAM: OBSTETRIC <14 WK [place] AND TRANSVAGINAL OB US TECHNIQUE: Both transabdominal and transvaginal ultrasound examinations were performed for complete evaluation of the gestation as well as the maternal uterus, adnexal regions, and pelvic cul-de-sac. Transvaginal technique was performed to assess early pregnancy. COMPARISON:  None. FINDINGS: Intrauterine gestational sac: None. Subchorionic hemorrhage:  None visualized. Maternal uterus/adnexae: IUD is visualized in the uterus. Right ovary measures 2.9 x 2.2 x 2.6 cm. Immediately adjacent to the right ovary is a 3.0 x 2.7 x 2.7 cm complex "mass". This  lesion does not have classic appearance of a gestational sac. Left ovary unremarkable. Small volume intraperitoneal free fluid. IMPRESSION: 1. No IUP. 2.9 cm right adnexal mass is identified adjacent to the right ovary. While this does not have classic sonographic imaging features for ectopic gestation, given the positive pregnancy test and lack of IUP, ectopic gestation is a distinct concern. IUP too early to visualize and completed abortion would also be considerations. 2. Small volume intraperitoneal free fluid. Electronically Signed   By: Kennith Center M.D.   On: 10/29/2020 19:47    MAU Management/MDM: Reviewed results of usual first trimester r/o ectopic labs.   Pelvic exam done in ED [place] reviewed by Dr Alysia Penna.  This bleeding/pain can represent a normal pregnancy with bleeding, spontaneous abortion or even an ectopic which can be life-threatening.  The process as listed above helps to determine which of these is present.  Discussed findings with patient and her mother Discussed that given + HCG and right adnexal mass, ectopic pregnancy is a possibility although not a certainty.   Options discussed with patient and her mother by myself and then by Dr Alysia Penna These include removing the IUD (50/50 risk of pregnancy loss with retaining it or removing it), following HCG in 48hours then repeating US in a week, or using Methotrexate for possible ectopic pregnancy.   She tells Dr Alysia Penna she wants to proceed with MTX.  See his note. MTX given per protocol  ASSESSMENT 1. Pregnant   2.   Pregnancy of  unknown location 3.    Right adnexal mass 4.    Probable ectopic pregnancy on right  PLAN Discharge home Plan to repeat HCG level on Sunday (day 4)  Ectopic precautions  Pt stable at time of discharge. Encouraged to return here if she develops worsening of symptoms, increase in pain, fever, or other concerning symptoms.    Wynelle Bourgeois CNM, MSN Certified Nurse-Midwife 10/29/2020  10:26  PM

## 2020-10-29 NOTE — ED Notes (Signed)
Ultrasound at bedside

## 2020-10-29 NOTE — ED Provider Notes (Signed)
[facility] EMERGENCY DEPARTMENT Provider Note   CSN: [id number] Arrival date & time: 10/29/20  1730     History Chief Complaint  Patient presents with   Abdominal Pain    Allison Mcbride is a 25 y.o. female.  The history is provided by the patient.  Abdominal Pain Associated symptoms: nausea and vaginal bleeding    25 year old female presenting to the emergency department with multiple complaints.  She states that she has had menstrual bleeding for the past month that has not resolved.  She states that she has an IUD in place [place]) and is not concerned for pregnancy.  She is not concerned for STIs.  She endorses right upper quadrant abdominal pain for the past 3 days with no radiation, described as sharp and worse when lying flat at night.  No shortness of breath or chest pain.  No aggravating or alleviating factors.  The patient also states that she has had lower pelvic cramping which she associated with her vaginal bleeding and what she thought to be was a prolonged menstrual cycle.  Past Medical History:  Diagnosis Date   Anxiety    Depression    Genital herpes    Rash due to allergy     There are no problems to display for this patient.   Past Surgical History:  Procedure Laterality Date   NO PAST SURGERIES       OB History     Gravida  1   Para      Term      Preterm      AB      Living         SAB      IAB      Ectopic      Multiple      Live Births              Family History  Problem Relation Age of Onset   Alcohol abuse Neg Hx    Arthritis Neg Hx    Asthma Neg Hx    Birth defects Neg Hx    Cancer Neg Hx    COPD Neg Hx    Depression Neg Hx    Diabetes Neg Hx    Drug abuse Neg Hx    Early death Neg Hx    Hearing loss Neg Hx    Heart disease Neg Hx    Hyperlipidemia Neg Hx    Hypertension Neg Hx    Kidney disease Neg Hx    Learning disabilities Neg Hx    Mental illness Neg Hx    Miscarriages / Stillbirths Neg  Hx    Mental retardation Neg Hx    Vision loss Neg Hx    Varicose Veins Neg Hx    Stroke Neg Hx     Social History   Tobacco Use   Smoking status: Never   Smokeless tobacco: Never  Vaping Use   Vaping Use: Never used  Substance Use Topics   Alcohol use: Yes   Drug use: No    Home Medications Prior to Admission medications   Medication Sig Start Date End Date Taking? Authorizing Provider  acyclovir (ZOVIRAX) 400 MG tablet Take one tablet three times a day x 7 days.  Repeat if lesions reappear. 03/19/13  Yes Karim-Rhoades, Walidah N, CNM  busPIRone (BUSPAR) 10 MG tablet Take 10 mg by mouth 2 (two) times daily.   Yes [provider]  escitalopram (LEXAPRO) 20 MG tablet Take  by mouth daily.   Yes [provider]  traMADol (ULTRAM) 50 MG tablet Take 1 tablet (50 mg total) by mouth every 6 (six) hours as needed for severe pain. 10/30/20  Yes Aviva Signs, CNM  metoCLOPramide (REGLAN) 10 MG tablet Take 1 tablet (10 mg total) by mouth 3 (three) times daily with meals. 07/08/13 07/08/14  Amedeo Gory, CNM    Allergies    Patient has no known allergies.  Review of Systems   Review of Systems  Gastrointestinal:  Positive for abdominal pain and nausea.  Genitourinary:  Positive for pelvic pain and vaginal bleeding.   Physical Exam Updated Vital Signs BP 105/66 (BP Location: Right Arm)   Pulse 69   Temp 98.7 F (37.1 C)   Resp 16   Ht 5\' 7"  (1.702 m)   Wt 72.6 kg   LMP 10/08/2020   SpO2 100%   BMI 25.06 kg/m   Physical Exam Vitals and nursing note reviewed.  Constitutional:      General: She is not in acute distress.    Appearance: She is well-developed.  HENT:     Head: Normocephalic and atraumatic.  Eyes:     Conjunctiva/sclera: Conjunctivae normal.  Cardiovascular:     Rate and Rhythm: Normal rate and regular rhythm.     Heart sounds: No murmur heard. Pulmonary:     Effort: Pulmonary effort is normal. No respiratory distress.      Breath sounds: Normal breath sounds.  Abdominal:     Palpations: Abdomen is soft.     Tenderness: There is abdominal tenderness in the right lower quadrant and suprapubic area. There is no guarding or rebound.  Genitourinary:    Comments: Pt requested to defer GU exam until she is seen by an OBGYN. Musculoskeletal:     Cervical back: Neck supple.  Skin:    General: Skin is warm and dry.  Neurological:     Mental Status: She is alert.    ED Results / Procedures / Treatments   Labs (all labs ordered are listed, but only abnormal results are displayed) Labs Reviewed  PREGNANCY, URINE - Abnormal; Notable for the following components:      Result Value   Preg Test, Ur POSITIVE (*)    All other components within normal limits  URINALYSIS, ROUTINE W REFLEX MICROSCOPIC - Abnormal; Notable for the following components:   Color, Urine STRAW (*)    All other components within normal limits  HCG, QUANTITATIVE, PREGNANCY - Abnormal; Notable for the following components:   hCG, Beta Chain, Quant, S 405 (*)    All other components within normal limits  CBC WITH DIFFERENTIAL/PLATELET  BASIC METABOLIC PANEL  LIPASE, BLOOD  HEPATIC FUNCTION PANEL    EKG None  Radiology 10/10/2020 OB LESS THAN 14 WEEKS WITH OB TRANSVAGINAL  Result Date: 10/29/2020 CLINICAL DATA:  Right-sided pelvic pain.  Positive pregnancy test. EXAM: OBSTETRIC <14 WK 10/31/2020 AND TRANSVAGINAL OB US TECHNIQUE: Both transabdominal and transvaginal ultrasound examinations were performed for complete evaluation of the gestation as well as the maternal uterus, adnexal regions, and pelvic cul-de-sac. Transvaginal technique was performed to assess early pregnancy. COMPARISON:  None. FINDINGS: Intrauterine gestational sac: None. Subchorionic hemorrhage:  None visualized. Maternal uterus/adnexae: IUD is visualized in the uterus. Right ovary measures 2.9 x 2.2 x 2.6 cm. Immediately adjacent to the right ovary is a 3.0 x 2.7 x 2.7 cm complex "mass".  This lesion does not have classic appearance of a gestational sac. Left ovary  unremarkable. Small volume intraperitoneal free fluid. IMPRESSION: 1. No IUP. 2.9 cm right adnexal mass is identified adjacent to the right ovary. While this does not have classic sonographic imaging features for ectopic gestation, given the positive pregnancy test and lack of IUP, ectopic gestation is a distinct concern. IUP too early to visualize and completed abortion would also be considerations. 2. Small volume intraperitoneal free fluid. Electronically Signed   By: Kennith Center M.D.   On: 10/29/2020 19:47    Procedures Procedures   Medications Ordered in ED Medications  alum & mag hydroxide-simeth (MAALOX/MYLANTA) 200-200-20 MG/5ML suspension 30 mL (30 mLs Oral Given 10/29/20 1804)    And  lidocaine (XYLOCAINE) 2 % viscous mouth solution 15 mL (15 mLs Oral Given 10/29/20 1807)  acetaminophen (TYLENOL) tablet 1,000 mg (1,000 mg Oral Given 10/29/20 1830)  ondansetron (ZOFRAN) injection 4 mg (4 mg Intravenous Given 10/29/20 1952)  HYDROmorphone (DILAUDID) injection 1 mg (1 mg Intravenous Given 10/29/20 2020)  methotrexate (for ectopic pregnancy) 25 mg/mL chemo injection (92.5 mg Intramuscular Given 10/30/20 0016)    ED Course  I have reviewed the triage vital signs and the nursing notes.  Pertinent labs & imaging results that were available during my care of the patient were reviewed by me and considered in my medical decision making (see chart for details).    MDM Rules/Calculators/A&P                           25 year old female presenting to the emergency department with multiple complaints.  She states that she has had menstrual bleeding for the past month that has not resolved.  She states that she has an IUD in place [place]) and is not concerned for pregnancy.  She is not concerned for STIs.  She endorses right upper quadrant abdominal pain for the past 3 days with no radiation, described as sharp and  worse when lying flat at night.  No shortness of breath or chest pain.  No aggravating or alleviating factors.  The patient also states that she has had lower pelvic cramping which she associated with her vaginal bleeding and what she thought to be was a prolonged menstrual cycle.  On arrival, the patient was afebrile, hemodynamically stable.  Bilateral lower abdominal tenderness to palpation on exam.  Differential diagnosis includes intrauterine pregnancy, missed or threatened abortion, ectopic pregnancy, IUD complication, appendicitis, pyelonephritis, nephrolithiasis, cholecystitis, peptic ulcer disease, GERD, ovarian torsion, tubo-ovarian abscess.  The patient's urine pregnancy test resulted positive.  Her beta hCG serum resulted positive greater than 400.  The patient deferred GU exam in the ED.  Pelvic ultrasound revealed the patient's Kyleena IUD in place, right-sided adnexal mass suspicious for possible ectopic pregnancy given the patient's finding of pregnancy.  While in the ED, the patient experienced worsening pain.  OB/GYN on-call was consulted and recommended transfer the patient to the MAU for further evaluation and monitoring.  Patient was updated on the plan of care and [facility] was contacted for transfer. Pt hemodynamically stable prior to transfer on reassessment. Final Clinical Impression(s) / ED Diagnoses Final diagnoses:  Pregnant  Pregnancy of unknown anatomic location  Adnexal mass  Pelvic pain affecting pregnancy in first trimester, antepartum  Bleeding in early pregnancy    Rx / DC Orders ED Discharge Orders          Ordered    traMADol (ULTRAM) 50 MG tablet  Every 6 hours PRN  10/30/20 0027    Discharge patient        10/30/20 2778             Ernie Avena, MD 10/30/20 1307

## 2020-10-29 NOTE — ED Notes (Signed)
Lab to add preq. Quant to lab work already sent

## 2020-10-29 NOTE — Progress Notes (Signed)
OB Attending  Discussed management options with pt and pt's mother. Including removal of IUD, follow up BHCG and MTX. Pt states this is not a desires pregnancy. Pt desires with MTX treatment. Will proceed with MTX per protocol.

## 2020-10-30 MED ORDER — TRAMADOL HCL 50 MG PO TABS: 50.0000 mg | ORAL_TABLET | Freq: Four times a day (QID) | ORAL | 0 refills | Status: DC | PRN

## 2020-10-30 NOTE — Progress Notes (Signed)
Wynelle Bourgeois CNM in earlier to discuss d/c plan. Written and verbal d/c instructions given and understanding voiced. Will return Sun night or Mon AM for repeat BHCG or sooner for any concerns.

## 2020-11-01 ENCOUNTER — Other Ambulatory Visit: Payer: Self-pay

## 2020-11-01 ENCOUNTER — Inpatient Hospital Stay (HOSPITAL_COMMUNITY)
Admission: AD | Admit: 2020-11-01 | Discharge: 2020-11-01 | Disposition: A | Discharge status: Home / Self Care | Payer: Medicaid Other | Attending: Family Medicine | Admitting: Family Medicine

## 2020-11-01 ENCOUNTER — Encounter (HOSPITAL_COMMUNITY): Payer: Self-pay | Admitting: Family Medicine

## 2020-11-01 DIAGNOSIS — Z6790 Unspecified blood type, Rh positive: Secondary | ICD-10-CM | POA: Diagnosis not present

## 2020-11-01 DIAGNOSIS — O00101 Right tubal pregnancy without intrauterine pregnancy: Secondary | ICD-10-CM | POA: Insufficient documentation

## 2020-11-01 LAB — HCG, QUANTITATIVE, PREGNANCY: hCG, Beta Chain, Quant, S: 225 m[IU]/mL — ABNORMAL HIGH (ref <5)

## 2020-11-01 NOTE — MAU Provider Note (Signed)
[name]  is a 25 y.o. G1P0 at [redacted]w[redacted]d who presents to MAU today for follow-up quant day 4 after MTX for Rt ectopic pregnancy. The patient was seen in MAU on 10/29/20 and had quant hCG of 405 and US showed no IUP, 2.9 cm right adnexal mass. She reports pain in the RLQ which is the same as when she presented 3 days ago. Rates pain 5/10. Using Tramadol which helps. Reports VB light a light period. No pain.   OB History  Gravida Para Term Preterm AB Living  1            SAB IAB Ectopic Multiple Live Births               # Outcome Date GA Lbr Len/2nd Weight Sex Delivery Anes PTL Lv  1 Current             Past Medical History:  Diagnosis Date   Anxiety    Depression    Genital herpes    Rash due to allergy     ROS: + VB + abd pain  BP 118/72 (BP Location: Right Arm)   Pulse 93   Temp 98.1 F (36.7 C) (Oral)   Resp 17   Ht 5\' 7"  (1.702 m)   Wt 76.7 kg   LMP 10/08/2020   SpO2 100%   BMI 26.48 kg/m   CONSTITUTIONAL: Well-developed, well-nourished female in no acute distress.  MUSCULOSKELETAL: Normal range of motion.  CARDIOVASCULAR: Regular heart rate RESPIRATORY: Normal effort ABD: +TTP RLQ and SP, no guarding, no rebound NEUROLOGICAL: Alert and oriented to person, place, and time.  SKIN: Not diaphoretic. No erythema. No pallor. PSYCH: Normal mood and affect. Normal behavior. Normal judgment and thought content.  Results for orders placed or performed during the hospital encounter of 11/01/20 (from the past 24 hour(s))  hCG, quantitative, pregnancy     Status: Abnormal   Collection Time: 11/01/20  8:24 PM  Result Value Ref Range   hCG, Beta Chain, Quant, S 225 (H) <5 mIU/mL   MDM: Labs ordered and reviewed. Good drop in quant. Consult with Dr. 11/03/20. Plan for day 7 qhcg. Discussed results and plan with pt and partner. Stable for discharge home.   A: 1. Right tubal pregnancy without intrauterine pregnancy   2. Blood type, Rh positive     P: Discharge home Strict  ectopic precautions discussed Pelvic rest Patient will return for follow-up quant HCG at [facility] on 11/04/20 Patient may return to MAU as needed or if her condition were to change or worsen   11/06/20, Donette Larry 11/01/2020 9:30 PM

## 2020-11-01 NOTE — MAU Note (Signed)
Pt reports to MAU for day 4 stat beta s/p MTX tx administered on 10/30/20.  Pt reports light vaginal bleeding and she is currently wearing panty liner.  Pt reports pain 5/10 on right lower abdomen.

## 2020-11-04 ENCOUNTER — Ambulatory Visit (INDEPENDENT_AMBULATORY_CARE_PROVIDER_SITE_OTHER): Payer: PRIVATE HEALTH INSURANCE

## 2020-11-04 ENCOUNTER — Other Ambulatory Visit: Payer: Self-pay

## 2020-11-04 ENCOUNTER — Telehealth: Payer: Self-pay

## 2020-11-04 VITALS — BP 125/70 | HR 97

## 2020-11-04 DIAGNOSIS — O00101 Right tubal pregnancy without intrauterine pregnancy: Secondary | ICD-10-CM

## 2020-11-04 LAB — BETA HCG QUANT (REF LAB): hCG Quant: 151 m[IU]/mL

## 2020-11-04 NOTE — Progress Notes (Signed)
Pt is in the office for STAT HCG following an ectopic pregnancy. Pt reports dark colored, but scant bleeding and some sharp pelvic pain 3/10.  Pt reports that the tramadol prescribed helps with the pain.

## 2020-11-04 NOTE — Telephone Encounter (Signed)
S/w patient and advised that in office provider reviewed HCG results, advised patient to follow up in 1 week for repeat labs, pt agreed. Scheduler will call for appt.

## 2020-11-04 NOTE — Progress Notes (Signed)
Patient was assessed and managed by nursing staff during this encounter. I have reviewed the chart and agree with the documentation and plan. I have also made any necessary editorial changes.  Appropriate drop in bhcg, repeat stat hcg in 1 week  Warden Fillers, MD 11/04/2020 9:15 PM

## 2020-11-11 ENCOUNTER — Other Ambulatory Visit: Payer: Self-pay

## 2020-11-11 ENCOUNTER — Ambulatory Visit: Payer: PRIVATE HEALTH INSURANCE

## 2020-12-11 ENCOUNTER — Emergency Department (HOSPITAL_BASED_OUTPATIENT_CLINIC_OR_DEPARTMENT_OTHER)
Admission: EM | Admit: 2020-12-11 | Discharge: 2020-12-11 | Disposition: A | Discharge status: Home / Self Care | Payer: PRIVATE HEALTH INSURANCE | Attending: Emergency Medicine | Admitting: Emergency Medicine

## 2020-12-11 ENCOUNTER — Encounter (HOSPITAL_BASED_OUTPATIENT_CLINIC_OR_DEPARTMENT_OTHER): Payer: Self-pay

## 2020-12-11 ENCOUNTER — Other Ambulatory Visit: Payer: Self-pay

## 2020-12-11 DIAGNOSIS — N9489 Other specified conditions associated with female genital organs and menstrual cycle: Secondary | ICD-10-CM | POA: Insufficient documentation

## 2020-12-11 DIAGNOSIS — J101 Influenza due to other identified influenza virus with other respiratory manifestations: Secondary | ICD-10-CM | POA: Insufficient documentation

## 2020-12-11 DIAGNOSIS — R112 Nausea with vomiting, unspecified: Secondary | ICD-10-CM

## 2020-12-11 DIAGNOSIS — Z20822 Contact with and (suspected) exposure to covid-19: Secondary | ICD-10-CM | POA: Insufficient documentation

## 2020-12-11 LAB — COMPREHENSIVE METABOLIC PANEL
ALT: 12 U/L (ref 0–44)
AST: 23 U/L (ref 15–41)
Albumin: 4.2 g/dL (ref 3.5–5.0)
Alkaline Phosphatase: 53 U/L (ref 38–126)
Anion gap: 16 — ABNORMAL HIGH (ref 5–15)
BUN: 6 mg/dL (ref 6–20)
CO2: 15 mmol/L — ABNORMAL LOW (ref 22–32)
Calcium: 9.6 mg/dL (ref 8.9–10.3)
Chloride: 106 mmol/L (ref 98–111)
Creatinine, Ser: 0.72 mg/dL (ref 0.44–1.00)
GFR, Estimated: >60 mL/min (ref >60)
Glucose, Bld: 97 mg/dL (ref 70–99)
Potassium: 3.2 mmol/L — ABNORMAL LOW (ref 3.5–5.1)
Sodium: 137 mmol/L (ref 135–145)
Total Bilirubin: 0.3 mg/dL (ref 0.3–1.2)
Total Protein: 7.7 g/dL (ref 6.5–8.1)

## 2020-12-11 LAB — URINALYSIS, ROUTINE W REFLEX MICROSCOPIC
Bilirubin Urine: NEGATIVE
Glucose, UA: NEGATIVE mg/dL
Ketones, ur: 40 mg/dL — AB
Leukocytes,Ua: NEGATIVE
Nitrite: NEGATIVE
Protein, ur: NEGATIVE mg/dL
Specific Gravity, Urine: 1.025 (ref 1.005–1.030)
pH: 5.5 (ref 5.0–8.0)

## 2020-12-11 LAB — RESP PANEL BY RT-PCR (FLU A&B, COVID) ARPGX2
Influenza A by PCR: POSITIVE — AB
Influenza B by PCR: NEGATIVE
SARS Coronavirus 2 by RT PCR: NEGATIVE

## 2020-12-11 LAB — HCG, SERUM, QUALITATIVE: Preg, Serum: NEGATIVE

## 2020-12-11 LAB — URINALYSIS, MICROSCOPIC (REFLEX)

## 2020-12-11 LAB — CBC WITH DIFFERENTIAL/PLATELET
Abs Immature Granulocytes: 0.04 10*3/uL (ref 0.00–0.07)
Basophils Absolute: 0 10*3/uL (ref 0.0–0.1)
Basophils Relative: 1 %
Eosinophils Absolute: 0 10*3/uL (ref 0.0–0.5)
Eosinophils Relative: 0 %
HCT: 35.4 % — ABNORMAL LOW (ref 36.0–46.0)
Hemoglobin: 11.9 g/dL — ABNORMAL LOW (ref 12.0–15.0)
Immature Granulocytes: 1 %
Lymphocytes Relative: 3 %
Lymphs Abs: 0.3 10*3/uL — ABNORMAL LOW (ref 0.7–4.0)
MCH: 30.7 pg (ref 26.0–34.0)
MCHC: 33.6 g/dL (ref 30.0–36.0)
MCV: 91.2 fL (ref 80.0–100.0)
Monocytes Absolute: 0.7 10*3/uL (ref 0.1–1.0)
Monocytes Relative: 9 %
Neutro Abs: 7.3 10*3/uL (ref 1.7–7.7)
Neutrophils Relative %: 86 %
Platelets: 209 10*3/uL (ref 150–400)
RBC: 3.88 MIL/uL (ref 3.87–5.11)
RDW: 13.7 % (ref 11.5–15.5)
WBC: 8.3 10*3/uL (ref 4.0–10.5)
nRBC: 0 % (ref 0.0–0.2)

## 2020-12-11 LAB — RAPID URINE DRUG SCREEN, HOSP PERFORMED
Amphetamines: NOT DETECTED
Barbiturates: NOT DETECTED
Benzodiazepines: NOT DETECTED
Cocaine: NOT DETECTED
Opiates: NOT DETECTED
Tetrahydrocannabinol: POSITIVE — AB

## 2020-12-11 LAB — LIPASE, BLOOD: Lipase: 22 U/L (ref 11–51)

## 2020-12-11 MED ORDER — OSELTAMIVIR PHOSPHATE 75 MG PO CAPS: 75.0000 mg | ORAL_CAPSULE | Freq: Two times a day (BID) | ORAL | 0 refills | Status: AC

## 2020-12-11 MED ORDER — ACETAMINOPHEN 500 MG PO TABS
1000.0000 mg | ORAL_TABLET | Freq: Once | ORAL | Status: AC
  Administered 2020-12-11: 1000 mg via ORAL
  Filled 2020-12-11: qty 2

## 2020-12-11 MED ORDER — POTASSIUM CHLORIDE 20 MEQ PO PACK
40.0000 meq | PACK | Freq: Once | ORAL | Status: AC
  Administered 2020-12-11: 40 meq via ORAL
  Filled 2020-12-11: qty 2

## 2020-12-11 MED ORDER — SODIUM CHLORIDE 0.9 % IV BOLUS
1000.0000 mL | Freq: Once | INTRAVENOUS | Status: AC
  Administered 2020-12-11: 1000 mL via INTRAVENOUS

## 2020-12-11 MED ORDER — ONDANSETRON HCL 4 MG/2ML IJ SOLN
4.0000 mg | Freq: Once | INTRAMUSCULAR | Status: AC
  Administered 2020-12-11: 4 mg via INTRAVENOUS
  Filled 2020-12-11: qty 2

## 2020-12-11 NOTE — ED Notes (Signed)
Pt d/c home per MD order. Discharge summary reviewed with pt, pt verbalizes understanding. Ambulatory off unit. No s/s of acute distress noted at discharge.  °

## 2020-12-11 NOTE — ED Triage Notes (Addendum)
Pt c/o abdominal pain, vomiting, shortness of breath since last night. Unable to keep anything down. Pt hyperventilating during triage, states her fingers are numb. Instructed to slow breathing.  Vomiting during triage

## 2020-12-11 NOTE — Discharge Instructions (Addendum)
Your test on today's visit was positive for influenza A.  Your laboratory results are within normal limits.  I have prescribed Tamiflu to help with your symptoms, please take 1 tablet every 12 hours for the next 5 days.  You may also continue treating your fever with Tylenol or Motrin.

## 2020-12-11 NOTE — ED Provider Notes (Signed)
[facility] EMERGENCY DEPARTMENT Provider Note   CSN: [id number] Arrival date & time: 12/11/20  1050     History Chief Complaint  Patient presents with   Vomiting    Allison Mcbride is a 25 y.o. female.  25 y.o female with a PMH of Anxiety, depression presents to the ED with a chief complaint of nausea, vomiting, shortness of breath that began this morning.  Patient reports that she is unable to keep anything down, states that she feels that she was unable to catch her breath.  She does endorse alcohol intake yesterday describing it as "I had a lot to drink ", according to significant other at the bedside she has several drinks of liquor along with mixed drinks.  She is also describing generalized abdominal pain, describing it as a constant ache, that is waxing and waning, not taking any medications for improvement in her symptoms.  She does report no food intake today.  Currently on Lexapro to help with her anxiety, not take this medication today.  She states she overall feels "I does not feel well ".  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  Denies any recent flu infections, fever, vaginal discharge or urinary symptoms.    The history is provided by the patient and medical records.      Past Medical History:  Diagnosis Date   Anxiety    Depression    Genital herpes    Rash due to allergy     There are no problems to display for this patient.   Past Surgical History:  Procedure Laterality Date   NO PAST SURGERIES       OB History     Gravida  1   Para      Term      Preterm      AB      Living         SAB      IAB      Ectopic      Multiple      Live Births              Family History  Problem Relation Age of Onset   Alcohol abuse Neg Hx    Arthritis Neg Hx    Asthma Neg Hx    Birth defects Neg Hx    Cancer Neg Hx    COPD Neg Hx    Depression Neg Hx    Diabetes  Neg Hx    Drug abuse Neg Hx    Early death Neg Hx    Hearing loss Neg Hx    Heart disease Neg Hx    Hyperlipidemia Neg Hx    Hypertension Neg Hx    Kidney disease Neg Hx    Learning disabilities Neg Hx    Mental illness Neg Hx    Miscarriages / Stillbirths Neg Hx    Mental retardation Neg Hx    Vision loss Neg Hx    Varicose Veins Neg Hx    Stroke Neg Hx     Social History   Tobacco Use   Smoking status: Never   Smokeless tobacco: Never  Vaping Use   Vaping Use: Never used  Substance Use Topics   Alcohol use: Yes   Drug use: No    Home Medications Prior to Admission medications   Medication Sig Start Date End Date Taking? Authorizing Provider  oseltamivir (TAMIFLU)  75 MG capsule Take 1 capsule (75 mg total) by mouth every 12 (twelve) hours for 5 days. 12/11/20 12/16/20 Yes Natalin Bible, Leonie Douglas, PA-C  acyclovir (ZOVIRAX) 400 MG tablet Take one tablet three times a day x 7 days.  Repeat if lesions reappear. 03/19/13   Karim-Rhoades, Kae Heller, CNM  busPIRone (BUSPAR) 10 MG tablet Take 10 mg by mouth 2 (two) times daily.    [provider]  escitalopram (LEXAPRO) 20 MG tablet Take by mouth daily.    [provider]  metoCLOPramide (REGLAN) 10 MG tablet Take 1 tablet (10 mg total) by mouth 3 (three) times daily with meals. 07/08/13 07/08/14  Amedeo Gory, CNM  traMADol (ULTRAM) 50 MG tablet Take 1 tablet (50 mg total) by mouth every 6 (six) hours as needed for severe pain. 10/30/20   Aviva Signs, CNM    Allergies    Patient has no known allergies.  Review of Systems   Review of Systems  Constitutional:  Negative for chills and fever.  Respiratory:  Positive for shortness of breath.   Cardiovascular:  Negative for chest pain.  Gastrointestinal:  Positive for abdominal pain.  Genitourinary:  Negative for difficulty urinating.  All other systems reviewed and are negative.  Physical Exam Updated Vital Signs BP 119/76   Pulse (!) 120   Temp 99.2  F (37.3 C)   Resp 18   Ht 5\' 7"  (1.702 m)   Wt 74.8 kg   LMP 10/02/2020 (Approximate)   SpO2 100%   Breastfeeding Unknown   BMI 25.84 kg/m   Physical Exam Vitals and nursing note reviewed.  Constitutional:      General: She is not in acute distress.    Appearance: She is well-developed.  HENT:     Head: Normocephalic and atraumatic.     Mouth/Throat:     Mouth: Mucous membranes are dry.  Eyes:     Conjunctiva/sclera: Conjunctivae normal.  Cardiovascular:     Rate and Rhythm: Normal rate and regular rhythm.     Heart sounds: No murmur heard. Pulmonary:     Effort: Pulmonary effort is normal. No respiratory distress.     Breath sounds: Normal breath sounds.  Abdominal:     Palpations: Abdomen is soft.     Tenderness: There is no abdominal tenderness.  Musculoskeletal:        General: No swelling.     Cervical back: Neck supple.  Skin:    General: Skin is warm and dry.     Capillary Refill: Capillary refill takes less than 2 seconds.  Neurological:     Mental Status: She is alert.  Psychiatric:        Mood and Affect: Mood normal.    ED Results / Procedures / Treatments   Labs (all labs ordered are listed, but only abnormal results are displayed) Labs Reviewed  RESP PANEL BY RT-PCR (FLU A&B, COVID) ARPGX2 - Abnormal; Notable for the following components:      Result Value   Influenza A by PCR POSITIVE (*)    All other components within normal limits  CBC WITH DIFFERENTIAL/PLATELET - Abnormal; Notable for the following components:   Hemoglobin 11.9 (*)    HCT 35.4 (*)    Lymphs Abs 0.3 (*)    All other components within normal limits  COMPREHENSIVE METABOLIC PANEL - Abnormal; Notable for the following components:   Potassium 3.2 (*)    CO2 15 (*)    Anion gap 16 (*)  All other components within normal limits  URINALYSIS, ROUTINE W REFLEX MICROSCOPIC - Abnormal; Notable for the following components:   Hgb urine dipstick SMALL (*)    Ketones, ur 40 (*)     All other components within normal limits  RAPID URINE DRUG SCREEN, HOSP PERFORMED - Abnormal; Notable for the following components:   Tetrahydrocannabinol POSITIVE (*)    All other components within normal limits  URINALYSIS, MICROSCOPIC (REFLEX) - Abnormal; Notable for the following components:   Bacteria, UA MANY (*)    All other components within normal limits  LIPASE, BLOOD  HCG, SERUM, QUALITATIVE    EKG None  Radiology No results found.  Procedures Procedures   Medications Ordered in ED Medications  sodium chloride 0.9 % bolus 1,000 mL (0 mLs Intravenous Stopped 12/11/20 1321)  ondansetron (ZOFRAN) injection 4 mg (4 mg Intravenous Given 12/11/20 1220)  potassium chloride (KLOR-CON) packet 40 mEq (40 mEq Oral Given 12/11/20 1429)  acetaminophen (TYLENOL) tablet 1,000 mg (1,000 mg Oral Given 12/11/20 1441)    ED Course  I have reviewed the triage vital signs and the nursing notes.  Pertinent labs & imaging results that were available during my care of the patient were reviewed by me and considered in my medical decision making (see chart for details).  Clinical Course as of 12/11/20 1526  Fri Dec 11, 2020  1424 Influenza A By PCR(!): POSITIVE [JS]  1525 Tetrahydrocannabinol(!): POSITIVE [JS]    Clinical Course User Index [JS] Claude Manges, PA-C   MDM Rules/Calculators/A&P    Presents to the ED with a chief complaint of nausea, vomiting which began this morning upon waking up.  Does report alcohol intake yesterday, has not had any food in her system today.  As she is describing as difficulty with taking a deep breath along with some chest pain.  Patient did recently take methotrexate in order to terminate a tubal pregnancy according to extensive chart review.  Reports her last menstrual period prior to this was several months ago.  No prior history of abdominal surgeries.  Does endorse marijuana use.  Exam on today's visit is unremarkable, abdomen is soft without any  focal point of tenderness.  Bowel sounds are present.  Lungs are clear to auscultation without any wheezing, rhonchi or rales.  She is overall nontoxic-appearing.  Given bolus along with Zofran to help with symptomatic treatment.  Interpretation of her labs reveal a CBC with no leukocytosis, hemoglobin slightly decreased.  Pregnancy test is negative.  CMP with slight decrease in her potassium, given oral dose of potassium while in the ED.  Creatinine levels within normal limits.  LFTs are unremarkable, no focal point of tenderness on repeated abdominal exams.  Lipase levels normal, denies any alcohol abuse but did have alcohol intake last night.  Rapid influenza a positive on today's visit.   These results were discussed at length with patient.  I did provide patient with a prescription for Tamiflu, she is to take this as prescribed.  It also are otherwise within normal limits, heart rate noted to be tachycardic on arrival, suspect some anxious component at this time.  However it has improved after fluids, with the last heart rate reading around the 100s.   Urinalysis without any signs consistent with infection.  UDS is positive for [facility] which she reports she perform yesterday.  We did discuss prescription for Tamiflu.  Patient continues to be slightly tachycardic, however febrile and given Tylenol prior to discharge.  Return precautions discussed at  length.  Patient stable for discharge.   Portions of this note were generated with Scientist, clinical (histocompatibility and immunogenetics). Dictation errors may occur despite best attempts at proofreading.  Final Clinical Impression(s) / ED Diagnoses Final diagnoses:  Nausea and vomiting, unspecified vomiting type  Influenza A    Rx / DC Orders ED Discharge Orders          Ordered    oseltamivir (TAMIFLU) 75 MG capsule  Every 12 hours        12/11/20 1439             Claude Manges, PA-C 12/11/20 1526    Milagros Loll, MD 12/12/20 0725

## 2022-06-09 ENCOUNTER — Encounter (HOSPITAL_BASED_OUTPATIENT_CLINIC_OR_DEPARTMENT_OTHER): Payer: Self-pay | Admitting: Obstetrics

## 2022-06-09 NOTE — Progress Notes (Signed)
Spoke w/ via phone for pre-op interview--- Allison Mcbride Lab needs dos----  UPT per surgeon            Lab results------ COVID test -----patient states asymptomatic no test needed Arrive at -------0530 NPO after MN NO Solid Food.   Med rec completed Medications to take morning of surgery -----NONE Diabetic medication ----- Patient instructed no nail polish to be worn day of surgery Patient instructed to bring photo id and insurance card day of surgery Patient aware to have Driver (ride ) / caregiver [name]   for 24 hours after surgery  Patient Special Instructions ----- Pre-Op special Instructions ----- Patient verbalized understanding of instructions that were given at this phone interview. Patient denies shortness of breath, chest pain, fever, cough at this phone interview.

## 2022-06-24 NOTE — Anesthesia Preprocedure Evaluation (Addendum)
Anesthesia Evaluation  Patient identified by MRN, date of birth, ID band Patient awake    Reviewed: Allergy & Precautions, H&P , NPO status , Patient's Chart, lab work & pertinent test results  Airway Mallampati: I  TM Distance: >3 FB Neck ROM: Full    Dental no notable dental hx. (+) Teeth Intact, Dental Advisory Given   Pulmonary neg pulmonary ROS   Pulmonary exam normal breath sounds clear to auscultation       Cardiovascular negative cardio ROS Normal cardiovascular exam Rhythm:Regular Rate:Normal     Neuro/Psych  PSYCHIATRIC DISORDERS Anxiety Depression    negative neurological ROS  negative psych ROS   GI/Hepatic negative GI ROS, Neg liver ROS,,,  Endo/Other  negative endocrine ROS    Renal/GU negative Renal ROS  negative genitourinary   Musculoskeletal negative musculoskeletal ROS (+)    Abdominal   Peds negative pediatric ROS (+)  Hematology negative hematology ROS (+)   Anesthesia Other Findings   Reproductive/Obstetrics negative OB ROS                             Anesthesia Physical Anesthesia Plan  ASA: 1  Anesthesia Plan: MAC   Post-op Pain Management: Toradol IV (intra-op)* and Tylenol PO (pre-op)*   Induction: Intravenous  PONV Risk Score and Plan: 3 and Treatment may vary due to age or medical condition, Ondansetron, Propofol infusion and Midazolam  Airway Management Planned: Nasal Cannula and Simple Face Mask  Additional Equipment: None  Intra-op Plan:   Post-operative Plan:   Informed Consent: I have reviewed the patients History and Physical, chart, labs and discussed the procedure including the risks, benefits and alternatives for the proposed anesthesia with the patient or authorized representative who has indicated his/her understanding and acceptance.     Dental advisory given  Plan Discussed with:   Anesthesia Plan Comments:          Anesthesia Quick Evaluation

## 2022-06-27 ENCOUNTER — Other Ambulatory Visit: Payer: Self-pay

## 2022-06-27 ENCOUNTER — Ambulatory Visit (HOSPITAL_BASED_OUTPATIENT_CLINIC_OR_DEPARTMENT_OTHER)
Admission: RE | Admit: 2022-06-27 | Discharge: 2022-06-27 | Disposition: A | Discharge status: Home / Self Care | Payer: Medicaid Other | Attending: Obstetrics | Admitting: Obstetrics

## 2022-06-27 ENCOUNTER — Ambulatory Visit (HOSPITAL_BASED_OUTPATIENT_CLINIC_OR_DEPARTMENT_OTHER): Payer: Medicaid Other | Admitting: Anesthesiology

## 2022-06-27 ENCOUNTER — Encounter (HOSPITAL_BASED_OUTPATIENT_CLINIC_OR_DEPARTMENT_OTHER)
Admission: RE | Disposition: A | Discharge status: Home / Self Care | Payer: Self-pay | Source: Home / Self Care | Attending: Obstetrics

## 2022-06-27 ENCOUNTER — Encounter (HOSPITAL_BASED_OUTPATIENT_CLINIC_OR_DEPARTMENT_OTHER): Payer: Self-pay | Admitting: Obstetrics

## 2022-06-27 DIAGNOSIS — Z30430 Encounter for insertion of intrauterine contraceptive device: Secondary | ICD-10-CM | POA: Diagnosis present

## 2022-06-27 DIAGNOSIS — F418 Other specified anxiety disorders: Secondary | ICD-10-CM | POA: Diagnosis not present

## 2022-06-27 DIAGNOSIS — Z975 Presence of (intrauterine) contraceptive device: Secondary | ICD-10-CM

## 2022-06-27 HISTORY — PX: INTRAUTERINE DEVICE (IUD) INSERTION: SHX5877

## 2022-06-27 LAB — POCT PREGNANCY, URINE: Preg Test, Ur: NEGATIVE

## 2022-06-27 SURGERY — INSERTION, INTRAUTERINE DEVICE
Anesthesia: Monitor Anesthesia Care | Site: Vagina

## 2022-06-27 MED ORDER — PROPOFOL 10 MG/ML IV BOLUS
INTRAVENOUS | Status: AC
  Filled 2022-06-27: qty 20

## 2022-06-27 MED ORDER — KETOROLAC TROMETHAMINE 30 MG/ML IJ SOLN: 30.0000 mg | Freq: Once | INTRAMUSCULAR | Status: DC | PRN

## 2022-06-27 MED ORDER — OXYCODONE HCL 5 MG/5ML PO SOLN: 5.0000 mg | Freq: Once | ORAL | Status: DC | PRN

## 2022-06-27 MED ORDER — LIDOCAINE HCL (PF) 2 % IJ SOLN
INTRAMUSCULAR | Status: AC
  Filled 2022-06-27: qty 5

## 2022-06-27 MED ORDER — ONDANSETRON HCL 4 MG/2ML IJ SOLN: 4.0000 mg | Freq: Once | INTRAMUSCULAR | Status: DC | PRN

## 2022-06-27 MED ORDER — OXYCODONE HCL 5 MG PO TABS: 5.0000 mg | ORAL_TABLET | Freq: Once | ORAL | Status: DC | PRN

## 2022-06-27 MED ORDER — HYDROMORPHONE HCL 1 MG/ML IJ SOLN: 0.2500 mg | INTRAMUSCULAR | Status: DC | PRN

## 2022-06-27 MED ORDER — MIDAZOLAM HCL 5 MG/5ML IJ SOLN
INTRAMUSCULAR | Status: DC | PRN
  Administered 2022-06-27: 2 mg via INTRAVENOUS

## 2022-06-27 MED ORDER — DEXAMETHASONE SODIUM PHOSPHATE 10 MG/ML IJ SOLN
INTRAMUSCULAR | Status: DC | PRN
  Administered 2022-06-27: 4 mg via INTRAVENOUS

## 2022-06-27 MED ORDER — ACETAMINOPHEN 10 MG/ML IV SOLN: 1000.0000 mg | Freq: Once | INTRAVENOUS | Status: DC | PRN

## 2022-06-27 MED ORDER — ONDANSETRON HCL 4 MG/2ML IJ SOLN
INTRAMUSCULAR | Status: AC
  Filled 2022-06-27: qty 2

## 2022-06-27 MED ORDER — LIDOCAINE 2% (20 MG/ML) 5 ML SYRINGE
INTRAMUSCULAR | Status: DC | PRN
  Administered 2022-06-27: 60 mg via INTRAVENOUS

## 2022-06-27 MED ORDER — FENTANYL CITRATE (PF) 100 MCG/2ML IJ SOLN
INTRAMUSCULAR | Status: DC | PRN
  Administered 2022-06-27: 100 ug via INTRAVENOUS

## 2022-06-27 MED ORDER — LIDOCAINE HCL 1 % IJ SOLN
INTRAMUSCULAR | Status: AC
  Filled 2022-06-27: qty 20

## 2022-06-27 MED ORDER — PROPOFOL 500 MG/50ML IV EMUL
INTRAVENOUS | Status: DC | PRN
  Administered 2022-06-27: 100 ug/kg/min via INTRAVENOUS

## 2022-06-27 MED ORDER — FENTANYL CITRATE (PF) 100 MCG/2ML IJ SOLN: 25.0000 ug | INTRAMUSCULAR | Status: DC | PRN

## 2022-06-27 MED ORDER — LEVONORGESTREL 19.5 MG IU IUD
1.0000 | INTRAUTERINE_SYSTEM | INTRAUTERINE | Status: AC
  Administered 2022-06-27: 1 via INTRAUTERINE
  Filled 2022-06-27: qty 1

## 2022-06-27 MED ORDER — KETOROLAC TROMETHAMINE 30 MG/ML IJ SOLN
INTRAMUSCULAR | Status: DC | PRN
  Administered 2022-06-27: 30 mg via INTRAVENOUS

## 2022-06-27 MED ORDER — LACTATED RINGERS IV SOLN
INTRAVENOUS | Status: DC
  Administered 2022-06-27: 1000 mL via INTRAVENOUS

## 2022-06-27 MED ORDER — KETOROLAC TROMETHAMINE 30 MG/ML IJ SOLN
INTRAMUSCULAR | Status: AC
  Filled 2022-06-27: qty 1

## 2022-06-27 MED ORDER — ACETAMINOPHEN 500 MG PO TABS
1000.0000 mg | ORAL_TABLET | Freq: Once | ORAL | Status: AC
  Administered 2022-06-27: 1000 mg via ORAL

## 2022-06-27 MED ORDER — DEXAMETHASONE SODIUM PHOSPHATE 10 MG/ML IJ SOLN
INTRAMUSCULAR | Status: AC
  Filled 2022-06-27: qty 1

## 2022-06-27 MED ORDER — AMISULPRIDE (ANTIEMETIC) 5 MG/2ML IV SOLN: 10.0000 mg | Freq: Once | INTRAVENOUS | Status: DC | PRN

## 2022-06-27 MED ORDER — FENTANYL CITRATE (PF) 100 MCG/2ML IJ SOLN
INTRAMUSCULAR | Status: AC
  Filled 2022-06-27: qty 2

## 2022-06-27 MED ORDER — MIDAZOLAM HCL 2 MG/2ML IJ SOLN
INTRAMUSCULAR | Status: AC
  Filled 2022-06-27: qty 2

## 2022-06-27 MED ORDER — PROPOFOL 10 MG/ML IV BOLUS
INTRAVENOUS | Status: DC | PRN
  Administered 2022-06-27: 75 mg via INTRAVENOUS

## 2022-06-27 MED ORDER — LIDOCAINE HCL (PF) 1 % IJ SOLN
INTRAMUSCULAR | Status: DC | PRN
  Administered 2022-06-27: 10 mL

## 2022-06-27 MED ORDER — ACETAMINOPHEN 500 MG PO TABS
ORAL_TABLET | ORAL | Status: AC
  Filled 2022-06-27: qty 2

## 2022-06-27 SURGICAL SUPPLY — 16 items
CATH ROBINSON RED A/P 16FR (CATHETERS) ×1 IMPLANT
CNTNR URN SCR LID CUP LEK RST (MISCELLANEOUS) ×1 IMPLANT
CONT SPEC 4OZ STRL OR WHT (MISCELLANEOUS) ×1
DRSG TELFA 3X8 NADH STRL (GAUZE/BANDAGES/DRESSINGS) ×1 IMPLANT
GAUZE 4X4 16PLY ~~LOC~~+RFID DBL (SPONGE) ×1 IMPLANT
GLOVE BIO SURGEON STRL SZ7 (GLOVE) ×1 IMPLANT
GLOVE BIOGEL PI IND STRL 6 (GLOVE) ×1 IMPLANT
GLOVE BIOGEL PI IND STRL 7.0 (GLOVE) ×1 IMPLANT
GLOVE ECLIPSE 6.0 STRL STRAW (GLOVE) ×1 IMPLANT
GOWN STRL REUS W/TWL LRG LVL3 (GOWN DISPOSABLE) ×2 IMPLANT
KIT TURNOVER CYSTO (KITS) ×1 IMPLANT
PACK VAGINAL MINOR WOMEN LF (CUSTOM PROCEDURE TRAY) ×1 IMPLANT
PAD OB MATERNITY 4.3X12.25 (PERSONAL CARE ITEMS) ×1 IMPLANT
PAD PREP 24X48 CUFFED NSTRL (MISCELLANEOUS) ×1 IMPLANT
SLEEVE SCD COMPRESS KNEE MED (STOCKING) ×1 IMPLANT
TOWEL OR 17X24 6PK STRL BLUE (TOWEL DISPOSABLE) ×1 IMPLANT

## 2022-06-27 NOTE — Anesthesia Postprocedure Evaluation (Signed)
Anesthesia Post Note  Patient: Allison Mcbride  Procedure(s) Performed: INTRAUTERINE DEVICE (IUD) INSERTION-KYLEENA (Vagina )     Patient location during evaluation: PACU Anesthesia Type: MAC Level of consciousness: awake and alert Pain management: pain level controlled Vital Signs Assessment: post-procedure vital signs reviewed and stable Respiratory status: spontaneous breathing, nonlabored ventilation, respiratory function stable and patient connected to nasal cannula oxygen Cardiovascular status: stable and blood pressure returned to baseline Postop Assessment: no apparent nausea or vomiting Anesthetic complications: no   No notable events documented.  Last Vitals:  Vitals:   06/27/22 0746 06/27/22 0807  BP: 134/75 113/87  Pulse: 84 74  Resp: 15 13  Temp:  36.6 C  SpO2: 99% 100%    Last Pain:  Vitals:   06/27/22 0807  TempSrc:   PainSc: 3                  Trevor Iha

## 2022-06-27 NOTE — Op Note (Signed)
Procedure: IUD insertion under anesthesia.   Preoperative diagnosis: Desires contraception Postoperative diagnosis: Same  Type of IUD: Rutha Bouchard  The informed consent was confirmed after risks, benefits, indications and alternatives for the procedure were reviewed with the patient. All questions were answered. A timeout was performed. The patient, procedure to be performed, site of surgery, and the identity of the individuals in the room were recognized.  With the patient in the dorsal lithotomy position, bimanual exam prior to insertion showed that the uterus was midline and a speculum was placed into the vagina.  Bimanual exam prior to insertion showed that the uterus was midline.   Cervix Washed:Betadine solution   Anesthetic Used: 10 mL 1% lidocaine    Tenaculum Placement:on the anterior cervical lip   Uterus sounded to:7.5 cm.  Dilation was not required. Manufacturer recommended technique for placement was used. US guidance was not used for placement.    Placement:good clinically   Written information was given to patient recommending when IUD should be removed. The patient is aware that she may experience spotting or irregular bleeding for several months after insertion. Cautions were given to return for fever or severe pain.   The patient tolerated the procedure well. She was monitored per protocol and discharged home after meeting criteria. Marland Kitchen

## 2022-06-27 NOTE — Transfer of Care (Signed)
Immediate Anesthesia Transfer of Care Note  Patient: Allison Mcbride  Procedure(s) Performed: INTRAUTERINE DEVICE (IUD) INSERTION-KYLEENA (Vagina )  Patient Location: PACU and Short Stay  Anesthesia Type:MAC  Level of Consciousness: awake, alert , and oriented  Airway & Oxygen Therapy: Patient Spontanous Breathing  Post-op Assessment: Report given to RN  Post vital signs: Reviewed and stable  Last Vitals:  Vitals Value Taken Time  BP 119/88 06/27/22 0735  Temp    Pulse 86 06/27/22 0735  Resp 12 06/27/22 0735  SpO2 99 % 06/27/22 0735  Vitals shown include unvalidated device data.  Last Pain:  Vitals:   06/27/22 0616  TempSrc: Oral  PainSc: 0-No pain      Patients Stated Pain Goal: 5 (06/27/22 1610)  Complications: No notable events documented.

## 2022-06-27 NOTE — Discharge Instructions (Addendum)
No acetaminophen/Tylenol until after 1:00pm today if needed for pain.   No ibuprofen, Advil, Aleve, Motrin, ketorolac, meloxicam, naproxen, or other NSAIDS until after 1:30pm today if needed for pain.   DISCHARGE INSTRUCTIONS: HYSTEROSCOPY  The following instructions have been prepared to help you care for yourself upon your return home.   Personal hygiene:  Use sanitary pads for vaginal drainage, not tampons.  Shower the day after your procedure.  NO tub baths, pools or Jacuzzis for 2-3 weeks.  Wipe front to back after using the bathroom.  Activity and limitations:  Do NOT drive or operate any equipment for 24 hours. The effects of anesthesia are still present and drowsiness may result.  Do NOT rest in bed all day.  Walking is encouraged.  Walk up and down stairs slowly.  You may resume your normal activity in one to two days or as indicated by your physician. Sexual activity: NO intercourse for at least 2 weeks after the procedure, or as indicated by your Doctor.  Diet: Eat a light meal as desired this evening. You may resume your usual diet tomorrow.  Return to Work: You may resume your work activities in one to two days or as indicated by Therapist, sports.  What to expect after your surgery: Expect to have vaginal bleeding/discharge for 2-3 days and spotting for up to 10 days. It is not unusual to have soreness for up to 1-2 weeks. You may have a slight burning sensation when you urinate for the first day. Mild cramps may continue for a couple of days. You may have a regular period in 2-6 weeks.  Call your doctor for any of the following:  Excessive vaginal bleeding or clotting, saturating and changing one pad every hour.  Inability to urinate 6 hours after discharge from hospital.  Pain not relieved by pain medication.  Fever of 100.4 F or greater.  Unusual vaginal discharge or odor.   Post Anesthesia Home Care Instructions  Activity: Get plenty of rest for the  remainder of the day. A responsible individual must stay with you for 24 hours following the procedure.  For the next 24 hours, DO NOT: -Drive a car -Advertising copywriter -Drink alcoholic beverages -Take any medication unless instructed by your physician -Make any legal decisions or sign important papers.  Meals: Start with liquid foods such as gelatin or soup. Progress to regular foods as tolerated. Avoid greasy, spicy, heavy foods. If nausea and/or vomiting occur, drink only clear liquids until the nausea and/or vomiting subsides. Call your physician if vomiting continues.  Special Instructions/Symptoms: Your throat may feel dry or sore from the anesthesia or the breathing tube placed in your throat during surgery. If this causes discomfort, gargle with warm salt water. The discomfort should disappear within 24 hours.

## 2022-06-27 NOTE — Anesthesia Procedure Notes (Signed)
Procedure Name: MAC Date/Time: 06/27/2022 7:20 AM  Performed by: Briant Sites, CRNAPre-anesthesia Checklist: Patient identified, Emergency Drugs available, Suction available, Patient being monitored and Timeout performed Patient Re-evaluated:Patient Re-evaluated prior to induction Oxygen Delivery Method: Nasal cannula Placement Confirmation: positive ETCO2

## 2022-06-27 NOTE — H&P (Signed)
27 y.o. G1P0 presents for IUD placement.  She desires a kyleena IUD--last IUD was accidentally pulled by partner earlier this year.  Past IUD insertion was traumatic and she desires anesthesia for insertion  Past Medical History:  Diagnosis Date   Anxiety    Depression    Genital herpes    Rash due to allergy     Past Surgical History:  Procedure Laterality Date   NO PAST SURGERIES      OB History  Gravida Para Term Preterm AB Living  1            SAB IAB Ectopic Multiple Live Births               # Outcome Date GA Lbr Len/2nd Weight Sex Delivery Anes PTL Lv  1 Gravida             Social History   Socioeconomic History   Marital status: Single    Spouse name: Not on file   Number of children: Not on file   Years of education: Not on file   Highest education level: Not on file  Occupational History   Not on file  Tobacco Use   Smoking status: Never   Smokeless tobacco: Never  Vaping Use   Vaping Use: Never used  Substance and Sexual Activity   Alcohol use: Yes   Drug use: No   Sexual activity: Yes    Partners: Male  Other Topics Concern   Not on file  Social History Narrative   Not on file   Social Determinants of Health   Financial Resource Strain: Not on file  Food Insecurity: Not on file  Transportation Needs: Not on file  Physical Activity: Not on file  Stress: Not on file  Social Connections: Not on file  Intimate Partner Violence: Not on file   Patient has no known allergies.    Vitals:   06/27/22 0616  BP: 122/78  Pulse: 96  Resp: 18  Temp: 98.3 F (36.8 C)  SpO2: 97%     General:  NAD Abdomen:  Soft Ex:  no edema    A/P   27 y.o. presents for IUD insertion.  Negative UPT. No unprotected intercourse in last two weeks. Discussed r/b/a, including cramping, infection, expulsion, device failure (pregnancy, including ectopic), irregular menses, need for removal, uterine perforation. Consent signed.   [facility] GEFFEL [employer]

## 2022-06-28 ENCOUNTER — Encounter (HOSPITAL_BASED_OUTPATIENT_CLINIC_OR_DEPARTMENT_OTHER): Payer: Self-pay | Admitting: Obstetrics

## 2022-12-26 IMAGING — US US OB < 14 WEEKS - US OB TV
1 series · 13 of 28 positions shown · non-contrast
Comparison: None.

CLINICAL DATA: Right-sided pelvic pain.  Positive pregnancy test.

EXAM:
OBSTETRIC <14 WK US AND TRANSVAGINAL OB US
TECHNIQUE: Both transabdominal and transvaginal ultrasound examinations were
performed for complete evaluation of the gestation as well as the
maternal uterus, adnexal regions, and pelvic cul-de-sac.
Transvaginal technique was performed to assess early pregnancy.

[Series 1: us ob < 14 weeks - us ob tv · 13 of 103 slices shown]
[im 4/103]
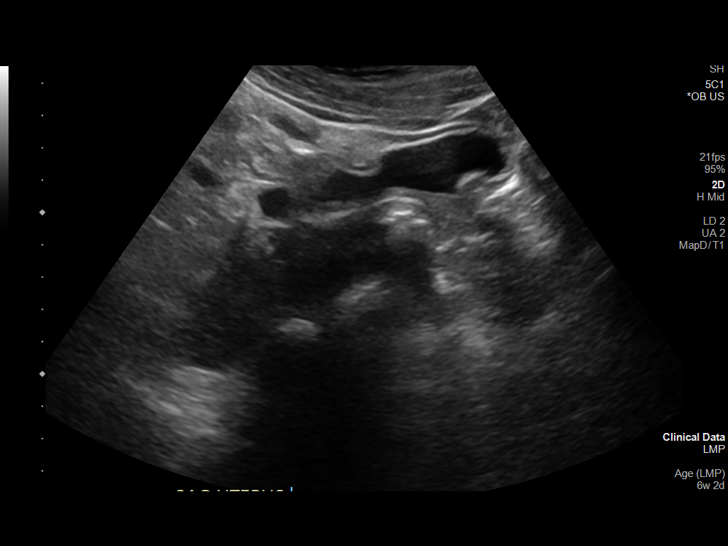
[im 12/103]
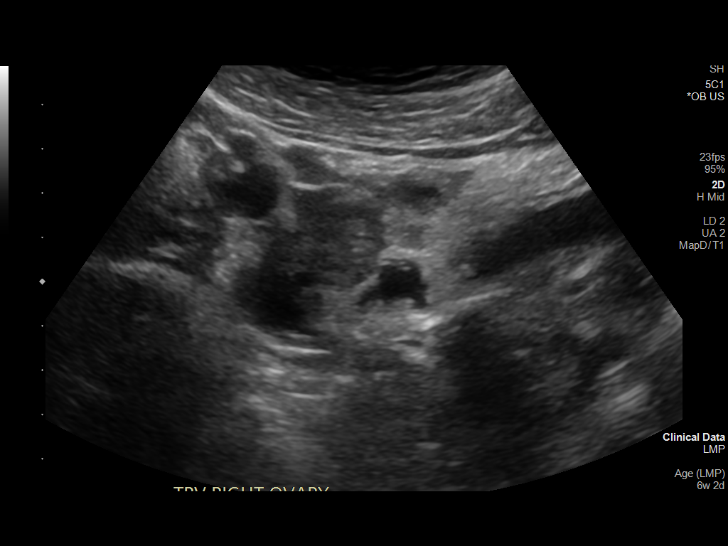
[im 19/103]
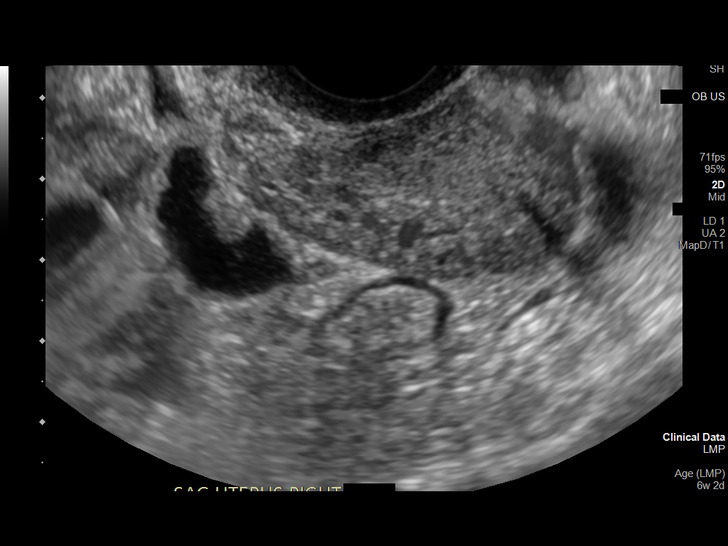
[im 27/103]
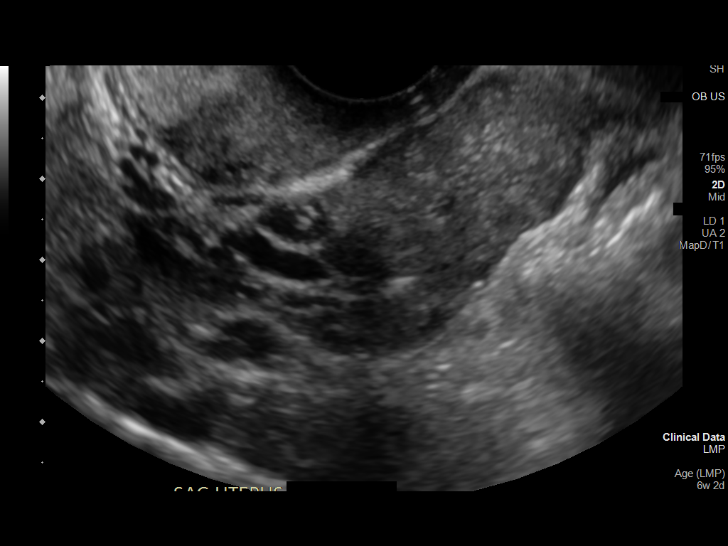
[im 35/103]
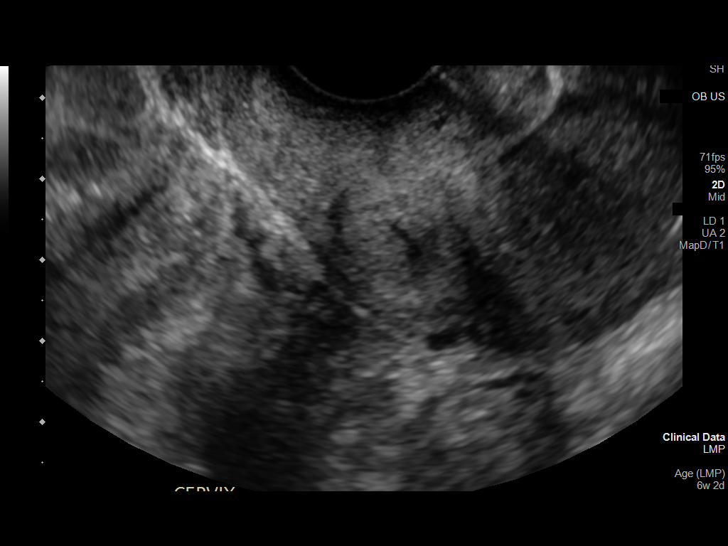
[im 42/103]
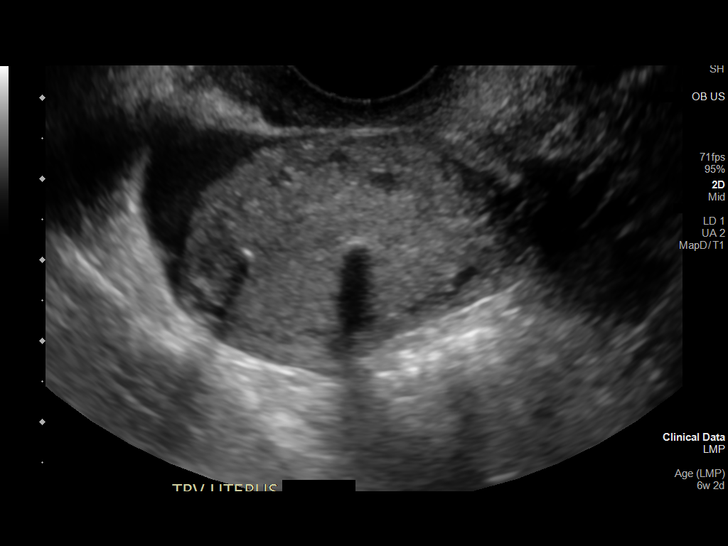
[im 53/103]
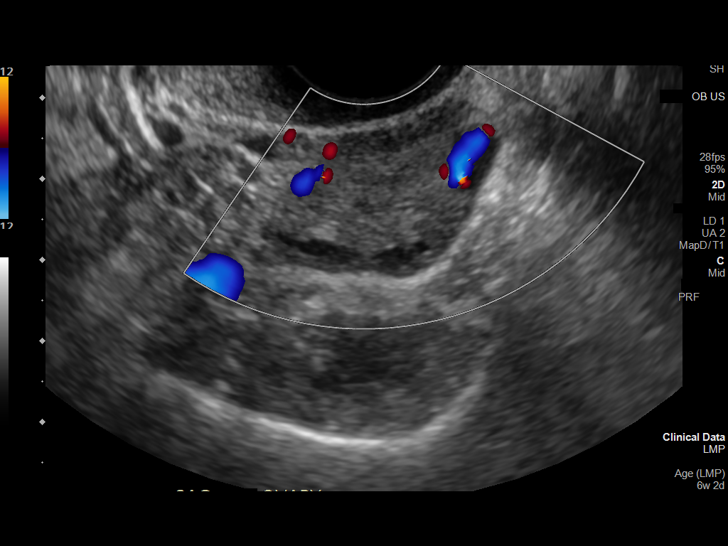
[im 61/103]
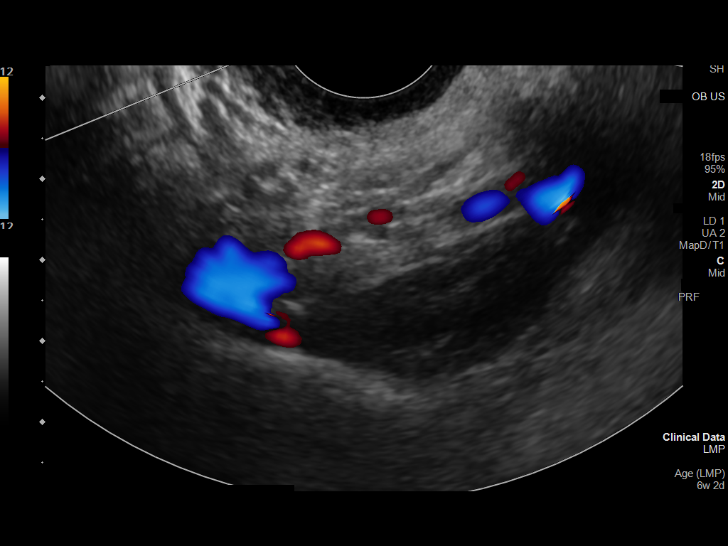
[im 69/103]
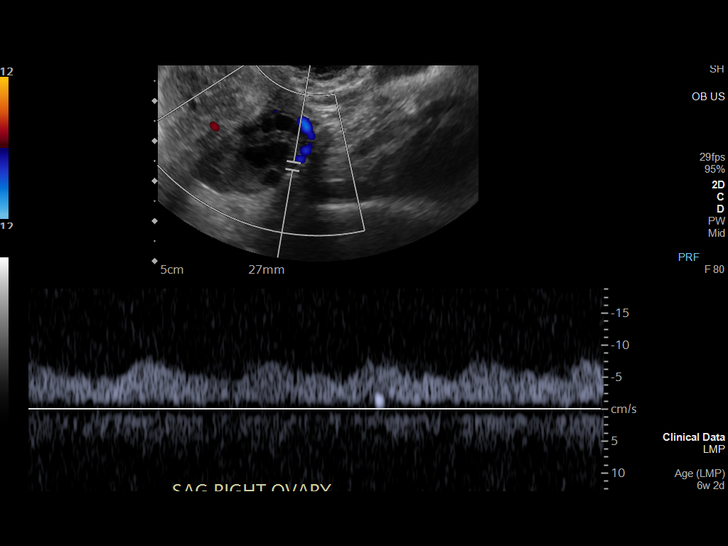
[im 76/103]
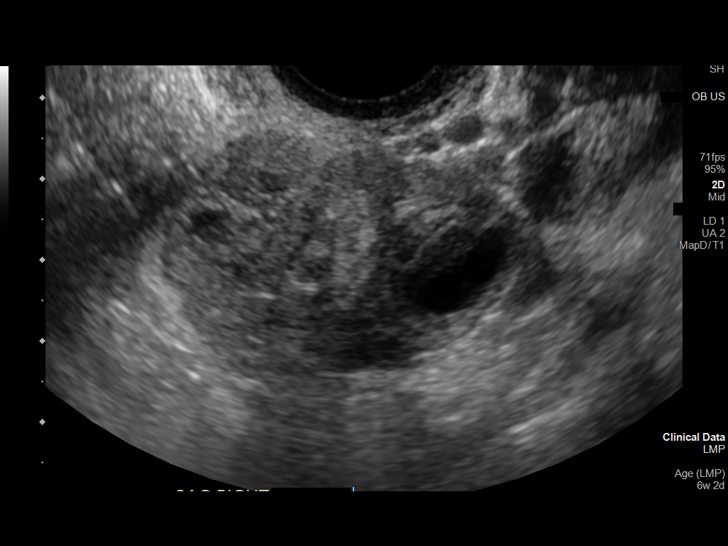
[im 84/103]
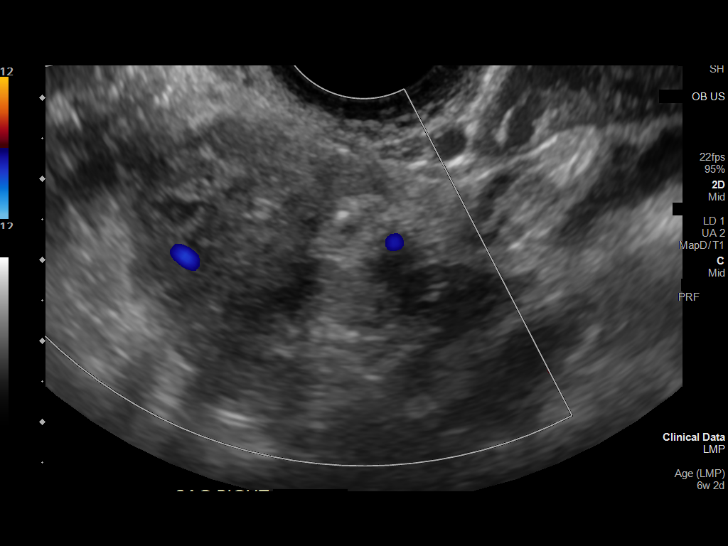
[im 91/103]
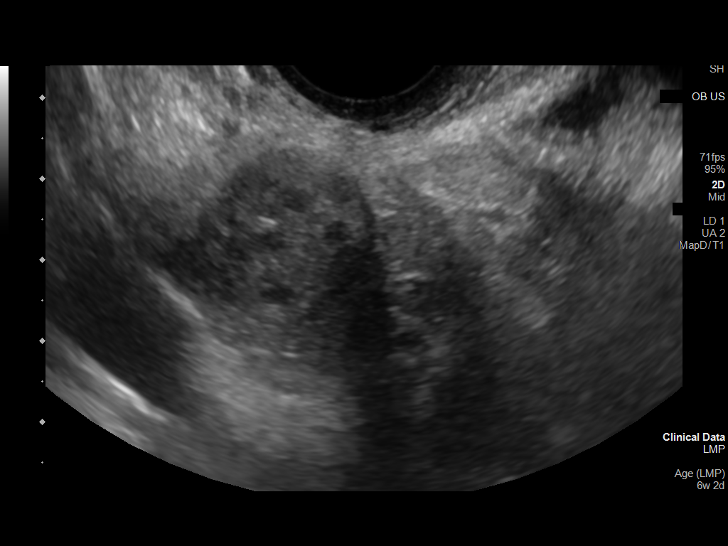
[im 99/103]
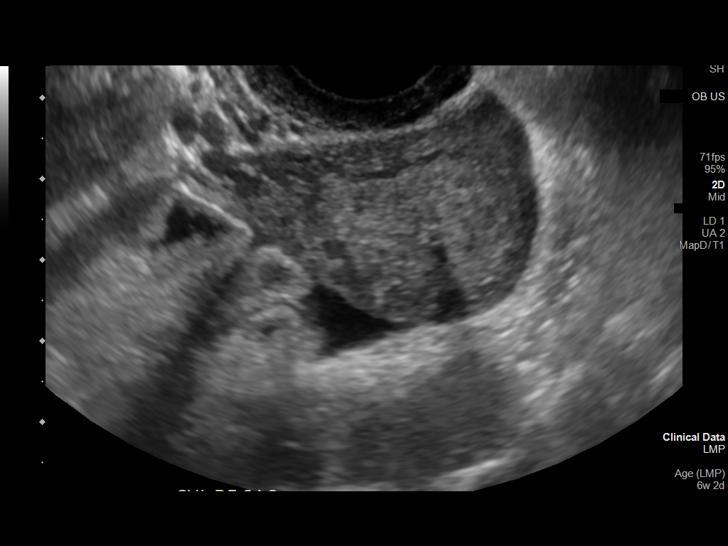

[13 of 28 positions shown; findings below may reference images not displayed]

FINDINGS: Intrauterine gestational sac: None.

Subchorionic hemorrhage:  None visualized.

Maternal uterus/adnexae: IUD is visualized in the uterus. Right
ovary measures 2.9 x 2.2 x 2.6 cm. Immediately adjacent to the right
ovary is a 3.0 x 2.7 x 2.7 cm complex "mass". This lesion does not
have classic appearance of a gestational sac. Left ovary
unremarkable. Small volume intraperitoneal free fluid.
IMPRESSION: 1. No IUP. 2.9 cm right adnexal mass is identified adjacent to the
right ovary. While this does not have classic sonographic imaging
features for ectopic gestation, given the positive pregnancy test
and lack of IUP, ectopic gestation is a distinct concern. IUP too
early to visualize and completed abortion would also be
considerations.
2. Small volume intraperitoneal free fluid.
